# Patient Record
Sex: Male | Born: 1960 | Race: Black or African American | Hispanic: No | Marital: Married | Smoking: Former smoker
Health system: Southern US, Community
[De-identification: ages and names within clinical notes are randomized; demographics above are authoritative.]

## PROBLEM LIST (undated history)

## (undated) DIAGNOSIS — E669 Obesity, unspecified: Secondary | ICD-10-CM

## (undated) DIAGNOSIS — N189 Chronic kidney disease, unspecified: Secondary | ICD-10-CM

## (undated) DIAGNOSIS — F329 Major depressive disorder, single episode, unspecified: Secondary | ICD-10-CM

## (undated) DIAGNOSIS — F32A Depression, unspecified: Secondary | ICD-10-CM

## (undated) DIAGNOSIS — M1990 Unspecified osteoarthritis, unspecified site: Secondary | ICD-10-CM

## (undated) DIAGNOSIS — E109 Type 1 diabetes mellitus without complications: Secondary | ICD-10-CM

## (undated) DIAGNOSIS — Z720 Tobacco use: Secondary | ICD-10-CM

## (undated) DIAGNOSIS — E785 Hyperlipidemia, unspecified: Secondary | ICD-10-CM

## (undated) DIAGNOSIS — M549 Dorsalgia, unspecified: Secondary | ICD-10-CM

## (undated) DIAGNOSIS — I10 Essential (primary) hypertension: Secondary | ICD-10-CM

## (undated) HISTORY — DX: Dorsalgia, unspecified: M54.9

## (undated) HISTORY — DX: Type 1 diabetes mellitus without complications: E10.9

## (undated) HISTORY — DX: Obesity, unspecified: E66.9

## (undated) HISTORY — DX: Essential (primary) hypertension: I10

## (undated) HISTORY — DX: Tobacco use: Z72.0

## (undated) HISTORY — DX: Major depressive disorder, single episode, unspecified: F32.9

## (undated) HISTORY — DX: Hyperlipidemia, unspecified: E78.5

## (undated) HISTORY — DX: Depression, unspecified: F32.A

---
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
## 1993-11-03 HISTORY — PX: OTHER SURGICAL HISTORY: SHX169

## 2001-03-04 ENCOUNTER — Encounter: Payer: Self-pay | Admitting: Family Medicine

## 2001-03-04 ENCOUNTER — Ambulatory Visit (HOSPITAL_COMMUNITY): Admission: RE | Admit: 2001-03-04 | Discharge: 2001-03-04 | Payer: Self-pay | Admitting: Family Medicine

## 2002-10-14 ENCOUNTER — Ambulatory Visit (HOSPITAL_COMMUNITY): Admission: RE | Admit: 2002-10-14 | Discharge: 2002-10-14 | Payer: Self-pay | Admitting: Pulmonary Disease

## 2003-10-09 ENCOUNTER — Ambulatory Visit (HOSPITAL_COMMUNITY): Admission: RE | Admit: 2003-10-09 | Discharge: 2003-10-09 | Payer: Self-pay | Admitting: Family Medicine

## 2003-10-31 ENCOUNTER — Ambulatory Visit (HOSPITAL_COMMUNITY): Admission: RE | Admit: 2003-10-31 | Discharge: 2003-10-31 | Payer: Self-pay | Admitting: Family Medicine

## 2003-11-29 ENCOUNTER — Ambulatory Visit (HOSPITAL_COMMUNITY): Admission: RE | Admit: 2003-11-29 | Discharge: 2003-11-29 | Payer: Self-pay | Admitting: Internal Medicine

## 2003-12-29 ENCOUNTER — Ambulatory Visit (HOSPITAL_COMMUNITY): Admission: RE | Admit: 2003-12-29 | Discharge: 2003-12-29 | Payer: Self-pay | Admitting: Family Medicine

## 2003-12-29 ENCOUNTER — Encounter: Payer: Self-pay | Admitting: Family Medicine

## 2004-01-04 ENCOUNTER — Encounter (HOSPITAL_COMMUNITY): Admission: RE | Admit: 2004-01-04 | Discharge: 2004-02-03 | Payer: Self-pay | Admitting: Family Medicine

## 2004-11-28 ENCOUNTER — Ambulatory Visit: Payer: Self-pay | Admitting: Family Medicine

## 2004-12-30 ENCOUNTER — Ambulatory Visit: Payer: Self-pay | Admitting: Family Medicine

## 2005-02-27 ENCOUNTER — Ambulatory Visit: Payer: Self-pay | Admitting: Family Medicine

## 2005-05-28 ENCOUNTER — Ambulatory Visit: Payer: Self-pay | Admitting: Family Medicine

## 2005-08-27 ENCOUNTER — Ambulatory Visit: Payer: Self-pay | Admitting: Orthopedic Surgery

## 2005-09-09 ENCOUNTER — Ambulatory Visit: Payer: Self-pay | Admitting: Family Medicine

## 2005-09-15 ENCOUNTER — Encounter: Admission: RE | Admit: 2005-09-15 | Discharge: 2005-09-15 | Payer: Self-pay | Admitting: Orthopedic Surgery

## 2005-09-30 ENCOUNTER — Encounter: Admission: RE | Admit: 2005-09-30 | Discharge: 2005-09-30 | Payer: Self-pay | Admitting: Orthopedic Surgery

## 2005-10-23 ENCOUNTER — Ambulatory Visit: Payer: Self-pay | Admitting: Family Medicine

## 2005-11-27 ENCOUNTER — Encounter: Payer: Self-pay | Admitting: Family Medicine

## 2005-11-27 ENCOUNTER — Ambulatory Visit (HOSPITAL_COMMUNITY): Admission: RE | Admit: 2005-11-27 | Discharge: 2005-11-27 | Payer: Self-pay | Admitting: Rheumatology

## 2005-12-01 ENCOUNTER — Ambulatory Visit: Payer: Self-pay | Admitting: Family Medicine

## 2006-02-19 ENCOUNTER — Ambulatory Visit: Payer: Self-pay | Admitting: Family Medicine

## 2006-02-23 ENCOUNTER — Emergency Department: Payer: Self-pay | Admitting: Emergency Medicine

## 2006-02-24 ENCOUNTER — Ambulatory Visit (HOSPITAL_COMMUNITY): Admission: RE | Admit: 2006-02-24 | Discharge: 2006-02-24 | Payer: Self-pay

## 2006-04-27 ENCOUNTER — Ambulatory Visit: Payer: Self-pay | Admitting: Family Medicine

## 2006-06-09 ENCOUNTER — Ambulatory Visit: Payer: Self-pay | Admitting: Family Medicine

## 2006-06-23 ENCOUNTER — Ambulatory Visit: Payer: Self-pay | Admitting: Anesthesiology

## 2006-07-08 ENCOUNTER — Ambulatory Visit: Payer: Self-pay | Admitting: Anesthesiology

## 2006-07-22 ENCOUNTER — Ambulatory Visit: Payer: Self-pay | Admitting: Family Medicine

## 2006-08-04 ENCOUNTER — Ambulatory Visit: Payer: Self-pay | Admitting: Anesthesiology

## 2006-08-25 ENCOUNTER — Ambulatory Visit: Payer: Self-pay | Admitting: Anesthesiology

## 2006-09-23 ENCOUNTER — Ambulatory Visit: Payer: Self-pay | Admitting: Family Medicine

## 2006-09-30 ENCOUNTER — Ambulatory Visit: Payer: Self-pay | Admitting: Anesthesiology

## 2006-10-16 ENCOUNTER — Emergency Department: Payer: Self-pay | Admitting: Emergency Medicine

## 2006-11-03 HISTORY — PX: BACK SURGERY: SHX140

## 2006-11-17 ENCOUNTER — Ambulatory Visit: Payer: Self-pay | Admitting: Anesthesiology

## 2006-11-20 ENCOUNTER — Ambulatory Visit: Payer: Self-pay | Admitting: Family Medicine

## 2006-11-25 ENCOUNTER — Ambulatory Visit: Payer: Self-pay | Admitting: Anesthesiology

## 2006-12-04 ENCOUNTER — Encounter (INDEPENDENT_AMBULATORY_CARE_PROVIDER_SITE_OTHER): Payer: Self-pay | Admitting: *Deleted

## 2006-12-17 ENCOUNTER — Ambulatory Visit: Payer: Self-pay | Admitting: Anesthesiology

## 2006-12-22 ENCOUNTER — Encounter: Payer: Self-pay | Admitting: Family Medicine

## 2006-12-22 LAB — CONVERTED CEMR LAB
Hgb A1c MFr Bld: 6.5 % — ABNORMAL HIGH (ref 4.6–6.1)
PSA: 0.26 ng/mL (ref 0.10–4.00)

## 2007-01-01 ENCOUNTER — Ambulatory Visit: Payer: Self-pay | Admitting: Family Medicine

## 2007-01-01 ENCOUNTER — Ambulatory Visit (HOSPITAL_COMMUNITY): Admission: RE | Admit: 2007-01-01 | Discharge: 2007-01-01 | Payer: Self-pay | Admitting: Neurosurgery

## 2007-01-14 ENCOUNTER — Inpatient Hospital Stay (HOSPITAL_COMMUNITY): Admission: RE | Admit: 2007-01-14 | Discharge: 2007-01-18 | Payer: Self-pay | Admitting: Neurosurgery

## 2007-01-18 ENCOUNTER — Encounter: Payer: Self-pay | Admitting: Family Medicine

## 2007-03-04 ENCOUNTER — Ambulatory Visit: Payer: Self-pay | Admitting: Family Medicine

## 2007-04-22 ENCOUNTER — Encounter: Payer: Self-pay | Admitting: Family Medicine

## 2007-04-22 LAB — CONVERTED CEMR LAB
ALT: 22 units/L (ref 0–53)
Albumin: 4.4 g/dL (ref 3.5–5.2)
Alkaline Phosphatase: 79 units/L (ref 39–117)
Chloride: 104 meq/L (ref 96–112)
HDL: 39 mg/dL — ABNORMAL LOW (ref >39)
LDL Cholesterol: 96 mg/dL (ref 0–99)
Potassium: 4.2 meq/L (ref 3.5–5.3)
Total Protein: 6.9 g/dL (ref 6.0–8.3)
Triglycerides: 114 mg/dL (ref <150)
VLDL: 23 mg/dL (ref 0–40)

## 2007-05-03 ENCOUNTER — Ambulatory Visit: Payer: Self-pay | Admitting: Family Medicine

## 2007-05-05 ENCOUNTER — Ambulatory Visit: Payer: Self-pay | Admitting: Family Medicine

## 2007-06-08 ENCOUNTER — Ambulatory Visit: Payer: Self-pay | Admitting: Family Medicine

## 2007-06-08 ENCOUNTER — Ambulatory Visit (HOSPITAL_COMMUNITY): Admission: RE | Admit: 2007-06-08 | Discharge: 2007-06-08 | Payer: Self-pay | Admitting: Family Medicine

## 2007-06-22 ENCOUNTER — Ambulatory Visit: Payer: Self-pay | Admitting: Cardiovascular Disease

## 2007-07-02 ENCOUNTER — Ambulatory Visit (HOSPITAL_COMMUNITY): Admission: RE | Admit: 2007-07-02 | Discharge: 2007-07-02 | Payer: Self-pay | Admitting: Family Medicine

## 2007-07-02 ENCOUNTER — Ambulatory Visit: Payer: Self-pay | Admitting: Family Medicine

## 2007-07-07 ENCOUNTER — Encounter (HOSPITAL_COMMUNITY): Admission: RE | Admit: 2007-07-07 | Discharge: 2007-08-03 | Payer: Self-pay | Admitting: Cardiovascular Disease

## 2007-07-07 ENCOUNTER — Ambulatory Visit: Payer: Self-pay | Admitting: Cardiology

## 2007-07-07 ENCOUNTER — Ambulatory Visit: Payer: Self-pay | Admitting: Family Medicine

## 2007-07-21 ENCOUNTER — Ambulatory Visit (HOSPITAL_COMMUNITY): Admission: RE | Admit: 2007-07-21 | Discharge: 2007-07-21 | Payer: Self-pay | Admitting: Rheumatology

## 2007-07-27 ENCOUNTER — Ambulatory Visit: Payer: Self-pay | Admitting: Family Medicine

## 2007-08-02 ENCOUNTER — Ambulatory Visit: Payer: Self-pay | Admitting: Family Medicine

## 2007-08-02 LAB — CONVERTED CEMR LAB: Microalb, Ur: 3.7 mg/dL — ABNORMAL HIGH (ref 0.00–1.89)

## 2007-11-04 ENCOUNTER — Encounter: Payer: Self-pay | Admitting: Family Medicine

## 2007-12-23 ENCOUNTER — Encounter: Payer: Self-pay | Admitting: Family Medicine

## 2007-12-23 LAB — CONVERTED CEMR LAB
ALT: 50 units/L (ref 0–53)
AST: 28 units/L (ref 0–37)
Albumin: 4.4 g/dL (ref 3.5–5.2)
Basophils Absolute: 0.1 10*3/uL (ref 0.0–0.1)
Cholesterol: 93 mg/dL (ref 0–200)
Eosinophils Absolute: 0.1 10*3/uL (ref 0.0–0.7)
Glucose, Bld: 152 mg/dL — ABNORMAL HIGH (ref 70–99)
HDL: 36 mg/dL — ABNORMAL LOW (ref >39)
Lymphs Abs: 2.5 10*3/uL (ref 0.7–4.0)
MCV: 89.9 fL (ref 78.0–100.0)
Neutrophils Relative %: 73 % (ref 43–77)
PSA: 0.21 ng/mL (ref 0.10–4.00)
Platelets: 211 10*3/uL (ref 150–400)
Potassium: 4.6 meq/L (ref 3.5–5.3)
RDW: 14.4 % (ref 11.5–15.5)
Sodium: 140 meq/L (ref 135–145)
Total CHOL/HDL Ratio: 2.6
Total Protein: 7.2 g/dL (ref 6.0–8.3)
Triglycerides: 151 mg/dL — ABNORMAL HIGH (ref <150)
VLDL: 30 mg/dL (ref 0–40)
WBC: 13.1 10*3/uL — ABNORMAL HIGH (ref 4.0–10.5)

## 2007-12-28 ENCOUNTER — Ambulatory Visit: Payer: Self-pay | Admitting: Family Medicine

## 2008-02-03 ENCOUNTER — Emergency Department (HOSPITAL_COMMUNITY): Admission: EM | Admit: 2008-02-03 | Discharge: 2008-02-03 | Payer: Self-pay | Admitting: Emergency Medicine

## 2008-02-07 ENCOUNTER — Ambulatory Visit: Payer: Self-pay | Admitting: Family Medicine

## 2008-02-09 ENCOUNTER — Encounter: Payer: Self-pay | Admitting: Family Medicine

## 2008-02-09 LAB — CONVERTED CEMR LAB
Hemoglobin, Urine: NEGATIVE
Ketones, ur: NEGATIVE mg/dL
Leukocytes, UA: NEGATIVE
Nitrite: NEGATIVE
Protein, ur: 30 mg/dL — AB

## 2008-02-15 ENCOUNTER — Encounter (INDEPENDENT_AMBULATORY_CARE_PROVIDER_SITE_OTHER): Payer: Self-pay | Admitting: *Deleted

## 2008-02-15 DIAGNOSIS — E669 Obesity, unspecified: Secondary | ICD-10-CM | POA: Insufficient documentation

## 2008-02-15 DIAGNOSIS — M47816 Spondylosis without myelopathy or radiculopathy, lumbar region: Secondary | ICD-10-CM | POA: Insufficient documentation

## 2008-02-15 DIAGNOSIS — I10 Essential (primary) hypertension: Secondary | ICD-10-CM | POA: Insufficient documentation

## 2008-02-15 DIAGNOSIS — E785 Hyperlipidemia, unspecified: Secondary | ICD-10-CM | POA: Insufficient documentation

## 2008-02-15 DIAGNOSIS — J309 Allergic rhinitis, unspecified: Secondary | ICD-10-CM | POA: Insufficient documentation

## 2008-05-22 ENCOUNTER — Ambulatory Visit: Payer: Self-pay | Admitting: Family Medicine

## 2008-05-22 ENCOUNTER — Ambulatory Visit (HOSPITAL_COMMUNITY): Admission: RE | Admit: 2008-05-22 | Discharge: 2008-05-22 | Payer: Self-pay | Admitting: Family Medicine

## 2008-06-11 ENCOUNTER — Emergency Department (HOSPITAL_COMMUNITY): Admission: EM | Admit: 2008-06-11 | Discharge: 2008-06-11 | Payer: Self-pay | Admitting: Emergency Medicine

## 2008-06-14 ENCOUNTER — Encounter: Payer: Self-pay | Admitting: Family Medicine

## 2008-06-15 ENCOUNTER — Encounter: Payer: Self-pay | Admitting: Family Medicine

## 2008-06-22 ENCOUNTER — Encounter: Payer: Self-pay | Admitting: Family Medicine

## 2008-06-27 ENCOUNTER — Encounter: Payer: Self-pay | Admitting: Family Medicine

## 2008-06-28 ENCOUNTER — Ambulatory Visit: Payer: Self-pay | Admitting: Family Medicine

## 2008-06-28 LAB — CONVERTED CEMR LAB: Hgb A1c MFr Bld: 9.1 %

## 2008-07-20 ENCOUNTER — Emergency Department (HOSPITAL_COMMUNITY): Admission: EM | Admit: 2008-07-20 | Discharge: 2008-07-20 | Payer: Self-pay | Admitting: Emergency Medicine

## 2008-07-27 ENCOUNTER — Telehealth: Payer: Self-pay | Admitting: Family Medicine

## 2008-08-14 ENCOUNTER — Encounter: Payer: Self-pay | Admitting: Family Medicine

## 2008-08-17 ENCOUNTER — Encounter: Payer: Self-pay | Admitting: Family Medicine

## 2008-08-29 ENCOUNTER — Ambulatory Visit: Payer: Self-pay | Admitting: Family Medicine

## 2008-08-29 DIAGNOSIS — Z794 Long term (current) use of insulin: Secondary | ICD-10-CM

## 2008-08-29 DIAGNOSIS — E1165 Type 2 diabetes mellitus with hyperglycemia: Secondary | ICD-10-CM

## 2008-08-29 DIAGNOSIS — IMO0001 Reserved for inherently not codable concepts without codable children: Secondary | ICD-10-CM | POA: Insufficient documentation

## 2008-08-29 LAB — CONVERTED CEMR LAB: Glucose, Bld: 293 mg/dL

## 2008-09-06 ENCOUNTER — Encounter: Payer: Self-pay | Admitting: Family Medicine

## 2008-09-19 ENCOUNTER — Encounter: Payer: Self-pay | Admitting: Family Medicine

## 2008-09-22 ENCOUNTER — Encounter: Payer: Self-pay | Admitting: Family Medicine

## 2008-09-22 LAB — CONVERTED CEMR LAB
AST: 14 units/L (ref 0–37)
Albumin: 4.4 g/dL (ref 3.5–5.2)
Alkaline Phosphatase: 90 units/L (ref 39–117)
CO2: 28 meq/L (ref 19–32)
Calcium: 9.1 mg/dL (ref 8.4–10.5)
Chloride: 100 meq/L (ref 96–112)
Creatinine, Ser: 0.78 mg/dL (ref 0.40–1.50)
HDL: 37 mg/dL — ABNORMAL LOW (ref >39)
LDL Cholesterol: 43 mg/dL (ref 0–99)
Sodium: 140 meq/L (ref 135–145)
Total Bilirubin: 0.4 mg/dL (ref 0.3–1.2)
Total CHOL/HDL Ratio: 2.8
Total Protein: 7.2 g/dL (ref 6.0–8.3)
Triglycerides: 109 mg/dL (ref <150)

## 2008-10-02 ENCOUNTER — Ambulatory Visit: Payer: Self-pay | Admitting: Family Medicine

## 2008-10-02 LAB — CONVERTED CEMR LAB
Creatinine, Urine: 130.2 mg/dL
Hgb A1c MFr Bld: 10.2 %
Microalb Creat Ratio: 300.3 mg/g — ABNORMAL HIGH (ref 0.0–30.0)
Microalb, Ur: 39.1 mg/dL — ABNORMAL HIGH (ref 0.00–1.89)

## 2008-10-05 ENCOUNTER — Encounter: Payer: Self-pay | Admitting: Family Medicine

## 2008-10-16 ENCOUNTER — Encounter: Payer: Self-pay | Admitting: Family Medicine

## 2008-11-06 ENCOUNTER — Ambulatory Visit: Payer: Self-pay | Admitting: Family Medicine

## 2008-12-06 ENCOUNTER — Ambulatory Visit: Payer: Self-pay | Admitting: Family Medicine

## 2008-12-06 LAB — CONVERTED CEMR LAB: Glucose, Bld: 208 mg/dL

## 2008-12-10 ENCOUNTER — Encounter: Payer: Self-pay | Admitting: Family Medicine

## 2008-12-10 ENCOUNTER — Emergency Department (HOSPITAL_COMMUNITY): Admission: EM | Admit: 2008-12-10 | Discharge: 2008-12-10 | Payer: Self-pay | Admitting: Emergency Medicine

## 2008-12-13 ENCOUNTER — Ambulatory Visit: Payer: Self-pay | Admitting: Family Medicine

## 2008-12-13 LAB — CONVERTED CEMR LAB: Glucose, Bld: 171 mg/dL

## 2008-12-14 ENCOUNTER — Telehealth: Payer: Self-pay | Admitting: Family Medicine

## 2008-12-14 LAB — CONVERTED CEMR LAB
BUN: 13 mg/dL (ref 6–23)
Basophils Absolute: 0.1 10*3/uL (ref 0.0–0.1)
CO2: 26 meq/L (ref 19–32)
Calcium: 9.6 mg/dL (ref 8.4–10.5)
Chloride: 100 meq/L (ref 96–112)
Creatinine, Ser: 0.89 mg/dL (ref 0.40–1.50)
Eosinophils Relative: 1 % (ref 0–5)
Glucose, Bld: 162 mg/dL — ABNORMAL HIGH (ref 70–99)
HCT: 52 % (ref 39.0–52.0)
Hemoglobin: 17.5 g/dL — ABNORMAL HIGH (ref 13.0–17.0)
Lymphocytes Relative: 18 % (ref 12–46)
Lymphs Abs: 2.6 10*3/uL (ref 0.7–4.0)
Monocytes Absolute: 1.2 10*3/uL — ABNORMAL HIGH (ref 0.1–1.0)
Monocytes Relative: 8 % (ref 3–12)
Neutro Abs: 10.7 10*3/uL — ABNORMAL HIGH (ref 1.7–7.7)
RBC: 5.9 M/uL — ABNORMAL HIGH (ref 4.22–5.81)

## 2008-12-27 ENCOUNTER — Encounter: Payer: Self-pay | Admitting: Family Medicine

## 2008-12-28 ENCOUNTER — Ambulatory Visit: Payer: Self-pay | Admitting: Family Medicine

## 2008-12-28 LAB — CONVERTED CEMR LAB: Hgb A1c MFr Bld: 8 %

## 2009-01-02 ENCOUNTER — Telehealth: Payer: Self-pay | Admitting: Family Medicine

## 2009-01-03 ENCOUNTER — Encounter: Payer: Self-pay | Admitting: Family Medicine

## 2009-01-05 ENCOUNTER — Encounter: Payer: Self-pay | Admitting: Family Medicine

## 2009-01-17 ENCOUNTER — Ambulatory Visit: Payer: Self-pay | Admitting: Family Medicine

## 2009-01-18 ENCOUNTER — Encounter: Payer: Self-pay | Admitting: Family Medicine

## 2009-02-23 ENCOUNTER — Encounter: Payer: Self-pay | Admitting: Family Medicine

## 2009-03-27 ENCOUNTER — Encounter: Payer: Self-pay | Admitting: Family Medicine

## 2009-04-03 ENCOUNTER — Ambulatory Visit: Payer: Self-pay | Admitting: Family Medicine

## 2009-04-03 LAB — CONVERTED CEMR LAB
Glucose, Bld: 190 mg/dL
Hgb A1c MFr Bld: 7 %
Microalb Creat Ratio: 620.2 mg/g — ABNORMAL HIGH (ref 0.0–30.0)
Microalb, Ur: 54.58 mg/dL — ABNORMAL HIGH (ref 0.00–1.89)

## 2009-04-06 ENCOUNTER — Telehealth: Payer: Self-pay | Admitting: Family Medicine

## 2009-04-06 ENCOUNTER — Encounter: Payer: Self-pay | Admitting: Family Medicine

## 2009-04-09 LAB — CONVERTED CEMR LAB
AST: 15 units/L (ref 0–37)
Bilirubin, Direct: 0.1 mg/dL (ref 0.0–0.3)
Indirect Bilirubin: 0.3 mg/dL (ref 0.0–0.9)
PSA: 0.23 ng/mL (ref 0.10–4.00)
TSH: 1.229 microintl units/mL (ref 0.350–4.500)
Total Bilirubin: 0.4 mg/dL (ref 0.3–1.2)
Total CHOL/HDL Ratio: 3.1

## 2009-04-10 ENCOUNTER — Encounter: Payer: Self-pay | Admitting: Family Medicine

## 2009-04-24 ENCOUNTER — Encounter: Payer: Self-pay | Admitting: Family Medicine

## 2009-05-17 ENCOUNTER — Encounter: Payer: Self-pay | Admitting: Family Medicine

## 2009-06-05 ENCOUNTER — Telehealth: Payer: Self-pay | Admitting: Family Medicine

## 2009-06-05 ENCOUNTER — Encounter: Payer: Self-pay | Admitting: Family Medicine

## 2009-06-06 ENCOUNTER — Encounter: Payer: Self-pay | Admitting: Family Medicine

## 2009-06-06 LAB — HM DIABETES EYE EXAM

## 2009-06-12 ENCOUNTER — Encounter: Payer: Self-pay | Admitting: Family Medicine

## 2009-06-25 ENCOUNTER — Encounter: Payer: Self-pay | Admitting: Family Medicine

## 2009-06-26 ENCOUNTER — Encounter: Payer: Self-pay | Admitting: Family Medicine

## 2009-07-03 ENCOUNTER — Telehealth: Payer: Self-pay | Admitting: Family Medicine

## 2009-07-10 ENCOUNTER — Encounter: Payer: Self-pay | Admitting: Family Medicine

## 2009-07-11 ENCOUNTER — Ambulatory Visit: Payer: Self-pay | Admitting: Family Medicine

## 2009-07-12 ENCOUNTER — Encounter: Payer: Self-pay | Admitting: Family Medicine

## 2009-07-16 ENCOUNTER — Encounter: Payer: Self-pay | Admitting: Family Medicine

## 2009-07-17 LAB — CONVERTED CEMR LAB: Hgb A1c MFr Bld: 7.7 % — ABNORMAL HIGH (ref 4.6–6.1)

## 2009-07-19 ENCOUNTER — Encounter: Payer: Self-pay | Admitting: Gastroenterology

## 2009-07-19 ENCOUNTER — Encounter: Payer: Self-pay | Admitting: Family Medicine

## 2009-08-20 ENCOUNTER — Other Ambulatory Visit: Payer: Self-pay | Admitting: Gastroenterology

## 2009-08-20 ENCOUNTER — Telehealth: Payer: Self-pay | Admitting: Family Medicine

## 2009-08-23 ENCOUNTER — Ambulatory Visit (HOSPITAL_COMMUNITY): Admission: RE | Admit: 2009-08-23 | Discharge: 2009-08-23 | Payer: Self-pay | Admitting: Gastroenterology

## 2009-08-23 ENCOUNTER — Encounter: Payer: Self-pay | Admitting: Gastroenterology

## 2009-08-23 ENCOUNTER — Ambulatory Visit: Payer: Self-pay | Admitting: Gastroenterology

## 2009-08-27 ENCOUNTER — Encounter (INDEPENDENT_AMBULATORY_CARE_PROVIDER_SITE_OTHER): Payer: Self-pay

## 2009-08-31 ENCOUNTER — Telehealth: Payer: Self-pay | Admitting: Family Medicine

## 2009-09-04 ENCOUNTER — Telehealth: Payer: Self-pay | Admitting: Family Medicine

## 2009-09-10 ENCOUNTER — Ambulatory Visit: Payer: Self-pay | Admitting: Family Medicine

## 2009-09-10 LAB — CONVERTED CEMR LAB: Glucose, Bld: 188 mg/dL

## 2009-09-18 ENCOUNTER — Telehealth: Payer: Self-pay | Admitting: Family Medicine

## 2009-10-01 ENCOUNTER — Encounter: Payer: Self-pay | Admitting: Family Medicine

## 2009-10-02 ENCOUNTER — Ambulatory Visit: Payer: Self-pay | Admitting: Family Medicine

## 2009-10-02 ENCOUNTER — Encounter: Payer: Self-pay | Admitting: Family Medicine

## 2009-10-02 ENCOUNTER — Ambulatory Visit (HOSPITAL_COMMUNITY): Admission: RE | Admit: 2009-10-02 | Discharge: 2009-10-02 | Payer: Self-pay | Admitting: Family Medicine

## 2009-10-02 LAB — CONVERTED CEMR LAB
Albumin: 4.4 g/dL (ref 3.5–5.2)
Alkaline Phosphatase: 94 units/L (ref 39–117)
BUN: 16 mg/dL (ref 6–23)
Creatinine, Ser: 0.8 mg/dL (ref 0.40–1.50)
Glucose, Bld: 75 mg/dL (ref 70–99)
HDL: 42 mg/dL (ref >39)
Hgb A1c MFr Bld: 6.8 %
LDL Cholesterol: 99 mg/dL (ref 0–99)
Sodium: 141 meq/L (ref 135–145)
Total Bilirubin: 0.4 mg/dL (ref 0.3–1.2)
Total CHOL/HDL Ratio: 4
Total Protein: 7 g/dL (ref 6.0–8.3)
Triglycerides: 144 mg/dL (ref <150)
VLDL: 29 mg/dL (ref 0–40)

## 2009-10-04 ENCOUNTER — Encounter: Payer: Self-pay | Admitting: Family Medicine

## 2009-10-30 ENCOUNTER — Ambulatory Visit: Payer: Self-pay | Admitting: Family Medicine

## 2009-10-30 LAB — CONVERTED CEMR LAB: Blood Glucose, Fasting: 98 mg/dL

## 2009-10-31 ENCOUNTER — Encounter: Payer: Self-pay | Admitting: Family Medicine

## 2009-11-07 ENCOUNTER — Encounter: Payer: Self-pay | Admitting: Family Medicine

## 2009-11-20 ENCOUNTER — Telehealth: Payer: Self-pay | Admitting: Family Medicine

## 2009-11-26 ENCOUNTER — Telehealth: Payer: Self-pay | Admitting: Family Medicine

## 2009-11-27 LAB — CONVERTED CEMR LAB
ALT: 17 units/L (ref 0–53)
Albumin: 4.5 g/dL (ref 3.5–5.2)
Chloride: 102 meq/L (ref 96–112)
Cholesterol: 92 mg/dL (ref 0–200)
HDL: 37 mg/dL — ABNORMAL LOW (ref >39)
Indirect Bilirubin: 0.3 mg/dL (ref 0.0–0.9)
LDL Cholesterol: 37 mg/dL (ref 0–99)
Potassium: 4.7 meq/L (ref 3.5–5.3)
Sodium: 140 meq/L (ref 135–145)
Total CHOL/HDL Ratio: 2.5
Total Protein: 7.3 g/dL (ref 6.0–8.3)
Triglycerides: 89 mg/dL (ref <150)
VLDL: 18 mg/dL (ref 0–40)

## 2009-11-30 ENCOUNTER — Encounter: Payer: Self-pay | Admitting: Family Medicine

## 2009-12-05 ENCOUNTER — Telehealth: Payer: Self-pay | Admitting: Family Medicine

## 2009-12-26 ENCOUNTER — Ambulatory Visit: Payer: Self-pay | Admitting: Family Medicine

## 2009-12-26 DIAGNOSIS — R5383 Other fatigue: Secondary | ICD-10-CM

## 2009-12-26 DIAGNOSIS — N529 Male erectile dysfunction, unspecified: Secondary | ICD-10-CM | POA: Insufficient documentation

## 2009-12-26 DIAGNOSIS — R5381 Other malaise: Secondary | ICD-10-CM | POA: Insufficient documentation

## 2009-12-28 LAB — CONVERTED CEMR LAB
Basophils Absolute: 0.1 10*3/uL (ref 0.0–0.1)
Basophils Relative: 1 % (ref 0–1)
Hemoglobin: 16 g/dL (ref 13.0–17.0)
Hgb A1c MFr Bld: 7.7 % — ABNORMAL HIGH (ref 4.6–6.1)
Lymphocytes Relative: 21 % (ref 12–46)
MCHC: 31.9 g/dL (ref 30.0–36.0)
Monocytes Absolute: 0.9 10*3/uL (ref 0.1–1.0)
Monocytes Relative: 9 % (ref 3–12)
Neutro Abs: 7.2 10*3/uL (ref 1.7–7.7)
Neutrophils Relative %: 68 % (ref 43–77)
RBC: 5.62 M/uL (ref 4.22–5.81)
RDW: 14.6 % (ref 11.5–15.5)

## 2010-01-17 ENCOUNTER — Encounter: Payer: Self-pay | Admitting: Family Medicine

## 2010-02-07 ENCOUNTER — Telehealth: Payer: Self-pay | Admitting: Family Medicine

## 2010-02-08 ENCOUNTER — Encounter: Payer: Self-pay | Admitting: Family Medicine

## 2010-02-21 ENCOUNTER — Ambulatory Visit: Payer: Self-pay | Admitting: Family Medicine

## 2010-02-21 LAB — CONVERTED CEMR LAB: Blood Glucose, Fasting: 132 mg/dL

## 2010-03-03 IMAGING — CR DG ABDOMEN ACUTE W/ 1V CHEST
3 series · 3 of 3 positions shown · non-contrast
Comparison: Chest x-ray of 06/11/2008

CLINICAL DATA: Upper mid abdominal pain radiating to the left

ACUTE ABDOMEN SERIES (ABDOMEN 2 VIEW & CHEST 1 VIEW)

[view not recorded (1 of 3)]
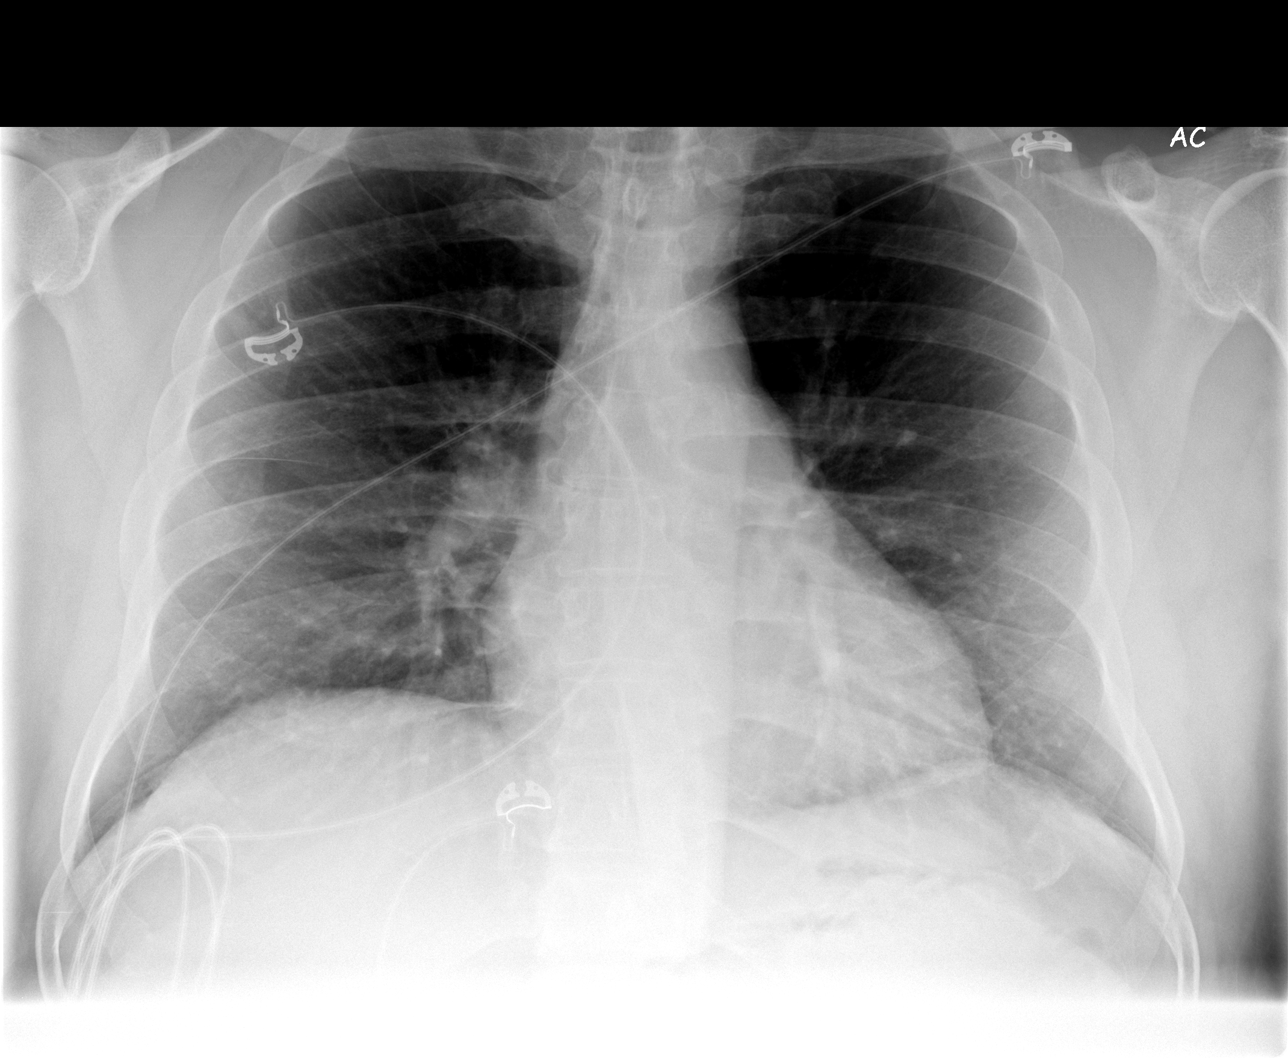

[view not recorded (2 of 3)]
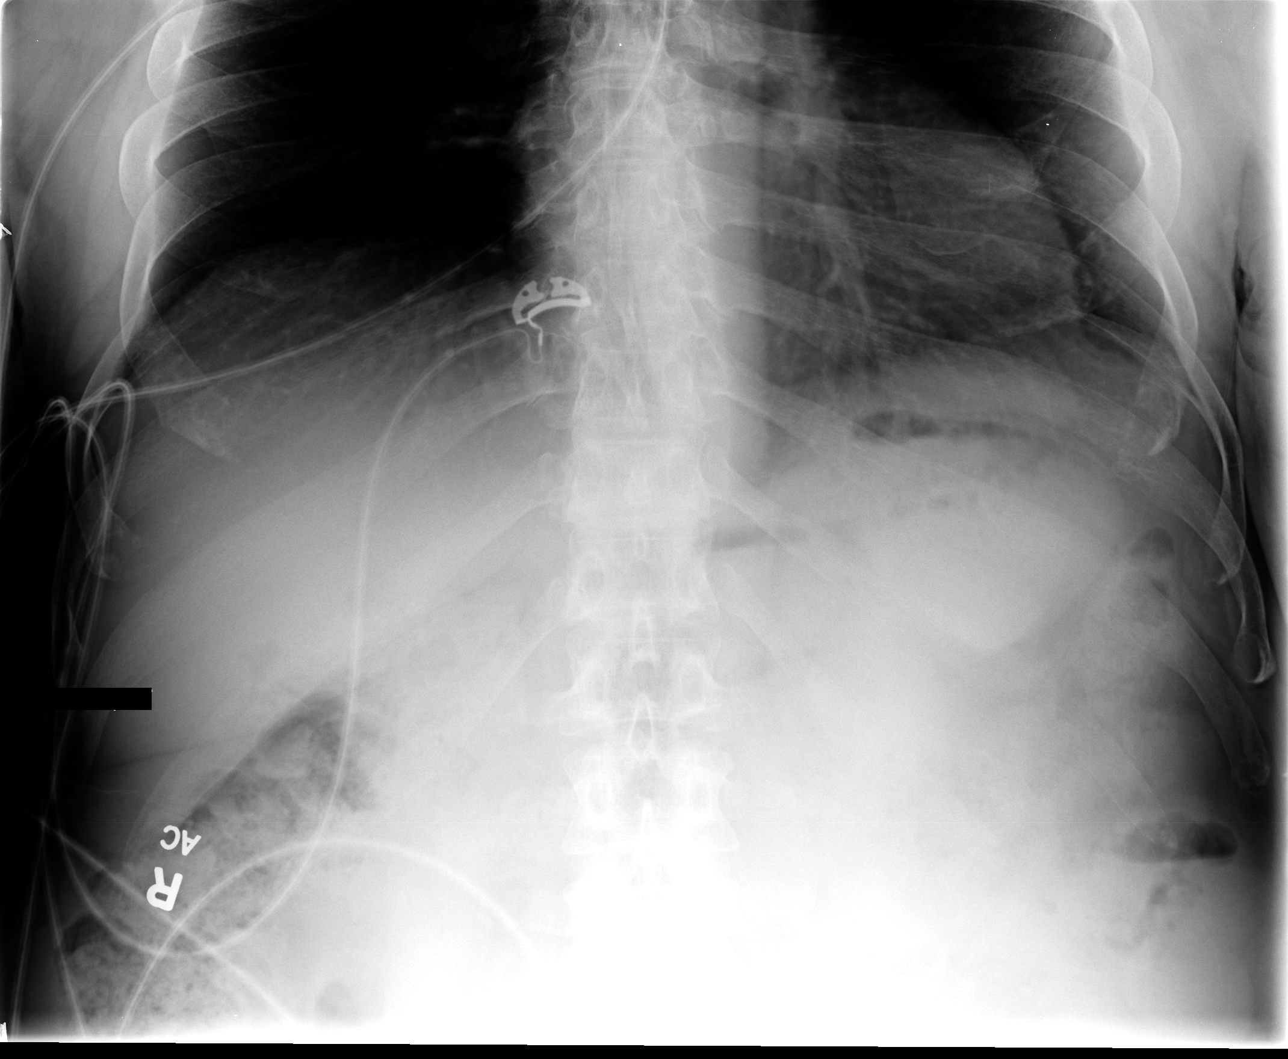

[view not recorded (3 of 3)]
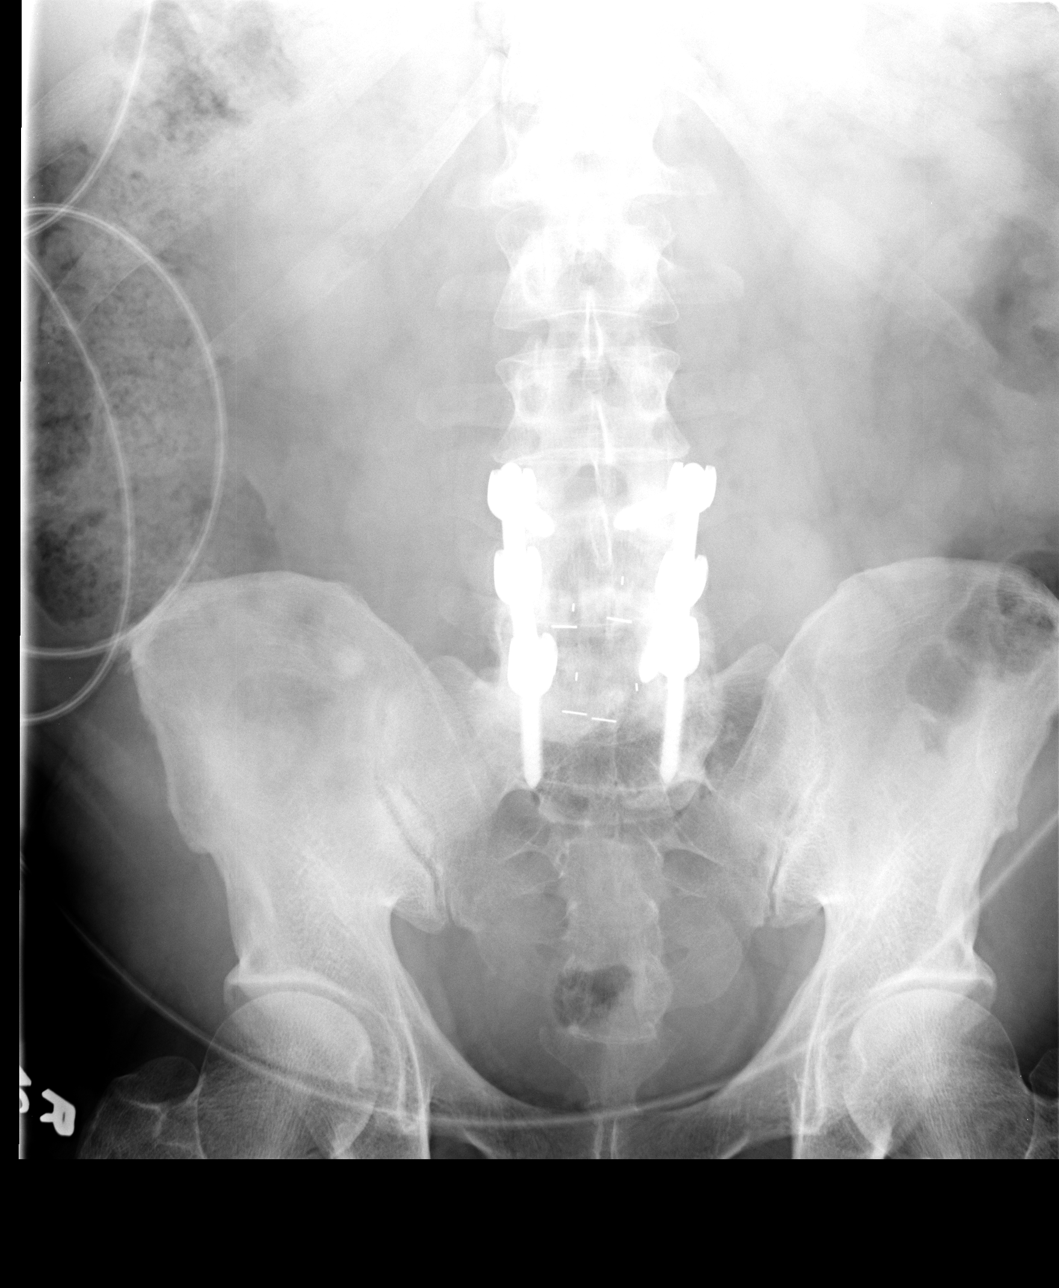

[3 of 3 positions shown; findings below may reference images not displayed]

FINDINGS: The lungs are clear.  Heart is within normal limits in
size.  No bony abnormality is seen.

Supine and erect views of the abdomen show a moderate amount of
feces in the right colon.  No bowel obstruction is seen and no free
air is noted.  Posterior fusion of L4, L5 and S1 is noted.
IMPRESSION: 1.  No active lung disease.
2.  No obstruction or free air.

## 2010-04-18 ENCOUNTER — Ambulatory Visit: Payer: Self-pay | Admitting: Family Medicine

## 2010-04-18 DIAGNOSIS — M25529 Pain in unspecified elbow: Secondary | ICD-10-CM | POA: Insufficient documentation

## 2010-04-19 ENCOUNTER — Encounter: Payer: Self-pay | Admitting: Family Medicine

## 2010-04-21 ENCOUNTER — Encounter: Payer: Self-pay | Admitting: Family Medicine

## 2010-04-21 LAB — CONVERTED CEMR LAB
Amphetamine Screen, Ur: NEGATIVE
Barbiturate Quant, Ur: NEGATIVE
Cocaine Metabolites: NEGATIVE
Methadone: NEGATIVE
Opiate Screen, Urine: NEGATIVE

## 2010-04-23 LAB — CONVERTED CEMR LAB: PSA: 0.22 ng/mL (ref 0.10–4.00)

## 2010-04-25 ENCOUNTER — Encounter: Payer: Self-pay | Admitting: Family Medicine

## 2010-05-02 ENCOUNTER — Encounter: Payer: Self-pay | Admitting: Family Medicine

## 2010-05-03 HISTORY — PX: OTHER SURGICAL HISTORY: SHX169

## 2010-05-15 ENCOUNTER — Encounter: Payer: Self-pay | Admitting: Family Medicine

## 2010-05-29 ENCOUNTER — Ambulatory Visit: Payer: Self-pay | Admitting: Unknown Physician Specialty

## 2010-05-30 LAB — PATHOLOGY REPORT

## 2010-06-04 ENCOUNTER — Ambulatory Visit: Payer: Self-pay | Admitting: Family Medicine

## 2010-06-05 ENCOUNTER — Encounter: Payer: Self-pay | Admitting: Family Medicine

## 2010-07-04 ENCOUNTER — Telehealth: Payer: Self-pay | Admitting: Family Medicine

## 2010-07-09 ENCOUNTER — Encounter: Payer: Self-pay | Admitting: Family Medicine

## 2010-07-12 ENCOUNTER — Encounter: Payer: Self-pay | Admitting: Family Medicine

## 2010-07-15 ENCOUNTER — Telehealth: Payer: Self-pay | Admitting: Family Medicine

## 2010-07-19 LAB — CONVERTED CEMR LAB
ALT: 38 units/L (ref 0–53)
BUN: 12 mg/dL (ref 6–23)
Bilirubin, Direct: 0.1 mg/dL (ref 0.0–0.3)
CO2: 27 meq/L (ref 19–32)
Chloride: 104 meq/L (ref 96–112)
Glucose, Bld: 108 mg/dL — ABNORMAL HIGH (ref 70–99)
Hgb A1c MFr Bld: 7.5 % — ABNORMAL HIGH (ref <5.7)
Indirect Bilirubin: 0.3 mg/dL (ref 0.0–0.9)
LDL Cholesterol: 36 mg/dL (ref 0–99)
Potassium: 4.5 meq/L (ref 3.5–5.3)
Sodium: 141 meq/L (ref 135–145)
Total Bilirubin: 0.4 mg/dL (ref 0.3–1.2)
Total CHOL/HDL Ratio: 2.5
VLDL: 12 mg/dL (ref 0–40)
Vit D, 25-Hydroxy: 29 ng/mL — ABNORMAL LOW (ref 30–89)

## 2010-07-22 ENCOUNTER — Telehealth: Payer: Self-pay | Admitting: Family Medicine

## 2010-07-23 ENCOUNTER — Encounter: Payer: Self-pay | Admitting: Family Medicine

## 2010-07-25 ENCOUNTER — Ambulatory Visit: Payer: Self-pay | Admitting: Family Medicine

## 2010-09-05 ENCOUNTER — Ambulatory Visit: Payer: Self-pay | Admitting: Family Medicine

## 2010-09-18 ENCOUNTER — Telehealth: Payer: Self-pay | Admitting: Family Medicine

## 2010-10-02 ENCOUNTER — Telehealth: Payer: Self-pay | Admitting: Family Medicine

## 2010-10-14 ENCOUNTER — Telehealth: Payer: Self-pay | Admitting: Family Medicine

## 2010-10-25 ENCOUNTER — Encounter: Payer: Self-pay | Admitting: Family Medicine

## 2010-11-11 ENCOUNTER — Encounter: Payer: Self-pay | Admitting: Family Medicine

## 2010-11-12 ENCOUNTER — Encounter: Payer: Self-pay | Admitting: Family Medicine

## 2010-11-12 LAB — CONVERTED CEMR LAB
Albumin: 4.5 g/dL (ref 3.5–5.2)
Alkaline Phosphatase: 85 units/L (ref 39–117)
Chloride: 102 meq/L (ref 96–112)
Creatinine, Ser: 0.67 mg/dL (ref 0.40–1.50)
HDL: 35 mg/dL — ABNORMAL LOW (ref >39)
LDL Cholesterol: 46 mg/dL (ref 0–99)
Potassium: 4.3 meq/L (ref 3.5–5.3)
Sodium: 140 meq/L (ref 135–145)
TSH: 1.12 microintl units/mL (ref 0.350–4.500)
Total CHOL/HDL Ratio: 2.9
Total Protein: 7.3 g/dL (ref 6.0–8.3)
Triglycerides: 101 mg/dL (ref <150)

## 2010-11-19 ENCOUNTER — Encounter: Payer: Self-pay | Admitting: Family Medicine

## 2010-11-19 ENCOUNTER — Ambulatory Visit
Admission: RE | Admit: 2010-11-19 | Discharge: 2010-11-19 | Payer: Self-pay | Source: Home / Self Care | Attending: Family Medicine | Admitting: Family Medicine

## 2010-11-23 ENCOUNTER — Encounter: Payer: Self-pay | Admitting: Orthopedic Surgery

## 2010-11-25 ENCOUNTER — Telehealth: Payer: Self-pay | Admitting: Family Medicine

## 2010-12-03 NOTE — Progress Notes (Signed)
Summary: [facility]   Phone Note Call from Patient   Summary of Call: [facility]  is going to fax over all his medicines  and he wants you to send it over and also add flex pen needles. this is where he is going to start getting his rx at Initial call taken by: Lind Guest,  October 02, 2010 2:01 PM  Follow-up for Phone Call        noted Follow-up by: Adella Hare LPN,  October 02, 2010 6:03 PM

## 2010-12-03 NOTE — Assessment & Plan Note (Signed)
Summary: office visit   Vital Signs:  Patient profile:   50 year old male Height:      70 inches Weight:      256.75 pounds BMI:     36.97 O2 Sat:      93 % Pulse rate:   90 / minute Pulse rhythm:   regular Resp:     16 per minute BP sitting:   146 / 100  (left arm)  Vitals Entered By: Everitt Amber LPN (February 21, 2010 8:17 AM)  Nutrition Counseling: Patient's BMI is greater than 25 and therefore counseled on weight management options. CC: Follow up chronic problems   Primary Care Provider:  Syliva Overman MD  CC:  Follow up chronic problems.  History of Present Illness: Reports  that he has not been doing well. Denies recent fever or chills. Denies sinus pressure, nasal congestion , ear pain or sore throat. Denies chest congestion, or cough productive of sputum. Denies chest pain, palpitations, PND, orthopnea or leg swelling. Denies abdominal pain, nausea, vomitting, diarrhea or constipation. Denies change in bowel movements or bloody stool. Denies dysuria , frequency, incontinence or hesitancy. Reports unconrolled back pain with nimbness and tingling in the feet. Denies headaches, vertigo, seizures. Denies depression, anxiety or insomnia. Denies  rash, lesions, or itch.       Current Medications (verified): 1)  Aspirin 81 Mg  Tbec (Aspirin) .... One Tab By Mouth Once Daily 2)  Exforge 10-320 Mg Tabs (Amlodipine Besylate-Valsartan) .... Take 1 Tablet By Mouth Once A Day 3)  Glucotrol 10 Mg Tabs (Glipizide) .... Take One Tab By Mouth Two Times A Day 4)  Crestor 20 Mg Tabs (Rosuvastatin Calcium) .... Take One Tab By Mouth Once Daily 5)  Lyrica 50 Mg Caps (Pregabalin) .... Take 1 Tablet By Mouth Three Times A Day 6)  Singulair 10 Mg Tabs (Montelukast Sodium) .... Take 1 Tablet By Mouth Once A Day 7)  Cialis 20 Mg Tabs (Tadalafil) .... Uad 8)  Flexeril 10 Mg Tabs (Cyclobenzaprine Hcl) .... Take 1 Tablet By Mouth Two Times A Day 9)  Ranitidine Hcl 150 Mg Caps  (Ranitidine Hcl) .... Take 1 Capsule By Mouth Two Times A Day 10)  Prodigy Blood Glucose Test  Strp (Glucose Blood) .... Twice Daily Testing 11)  Freestyle Test  Strp (Glucose Blood) .... Three Times A Day 12)  Benazepril Hcl 40 Mg Tabs (Benazepril Hcl) .... Take 1 Tablet By Mouth Once A Day 13)  Tramadol Hcl 50 Mg Tabs (Tramadol Hcl) .... Take 1 Tablet By Mouth Three Times A Day 14)  Levemir Flexpen 100 Unit/ml Soln (Insulin Detemir) .Marland Kitchen.. 100 Units Daily 15)  Janumet 50-1000 Mg Tabs (Sitagliptin-Metformin Hcl) .... Take 1 Tablet By Mouth Two Times A Day 16)  Clonidine Hcl 0.3 Mg Tabs (Clonidine Hcl) .... One and A Half Tablets At Bedtime 17)  Etodolac 500 Mg Tabs (Etodolac) .... Take 1 Tablet By Mouth Two Times A Day 18)  Pen Needles 5/16" 31g X 8 Mm Misc (Insulin Pen Needle) .... Once Daily Use  Allergies (verified): 1)  ! Codeine  Review of Systems      See HPI Eyes:  Denies discharge, eye pain, and red eye. MS:  Complains of low back pain and mid back pain; uncontrolled low back , pelvis and lower ext pain, stabbing, sticking pains. Endo:  Denies excessive hunger and excessive thirst; tests 3 times daily, fastings 90 to 115, after lunch 210, bedtime 230. Heme:  Denies abnormal bruising and  bleeding. Allergy:  Complains of seasonal allergies.  Physical Exam  General:  Well-developed,obese,in no acute distress; alert,appropriate and cooperative throughout examination HEENT: No facial asymmetry,  EOMI, No sinus tenderness, TM's Clear, oropharynx  pink and moist.   Chest: Clear to auscultation bilaterally.  CVS: S1, S2, No murmurs, No S3.   Abd: Soft, Nontender.  MS: decreased ROM spine,adequate in  hips, shoulders and knees.  Ext: No edema.   CNS: CN 2-12 intact, power tone and sensation normal throughout.   Skin: Intact, no visible lesions or rashes.  Psych: Good eye contact, normal affect.  Memory intact, not anxious or depressed appearing.    Impression &  Recommendations:  Problem # 1:  DIABETES MELLITUS, TYPE II (ICD-250.00) Assessment Comment Only  The following medications were removed from the medication list:    Levemir Flexpen 100 Unit/ml Soln (Insulin detemir) .Marland KitchenMarland KitchenMarland KitchenMarland Kitchen 100 units daily His updated medication list for this problem includes:    Aspirin 81 Mg Tbec (Aspirin) ..... One tab by mouth once daily    Exforge 10-320 Mg Tabs (Amlodipine besylate-valsartan) .Marland Kitchen... Take 1 tablet by mouth once a day    Glucotrol 10 Mg Tabs (Glipizide) .Marland Kitchen... Take one tab by mouth two times a day    Benazepril Hcl 40 Mg Tabs (Benazepril hcl) .Marland Kitchen... Take 1 tablet by mouth once a day    Janumet 50-1000 Mg Tabs (Sitagliptin-metformin hcl) .Marland Kitchen... Take 1 tablet by mouth two times a day    Levemir Flexpen 100 Unit/ml Soln (Insulin detemir) .Marland KitchenMarland KitchenMarland KitchenMarland Kitchen 60 units twice daily  Orders: Glucose, (CBG) (82962) T- Hemoglobin A1C [phone])  Labs Reviewed: Creat: 0.74 (11/26/2009)     Last Eye Exam: diabetic retinopathy (06/06/2009) Reviewed HgBA1c results: 7.7 (12/26/2009)  6.8 (10/02/2009)  Problem # 2:  ERECTILE DYSFUNCTION, ORGANIC (ICD-607.84) Assessment: Unchanged  His updated medication list for this problem includes:    Cialis 20 Mg Tabs (Tadalafil) ..... Uad  Problem # 3:  HYPERLIPIDEMIA (ICD-272.4) Assessment: Comment Only  His updated medication list for this problem includes:    Crestor 20 Mg Tabs (Rosuvastatin calcium) .Marland Kitchen... Take one tab by mouth once daily  Labs Reviewed: SGOT: 14 (11/26/2009)   SGPT: 17 (11/26/2009)   HDL:37 (11/26/2009), 42 (10/02/2009)  LDL:37 (11/26/2009), 99 (10/02/2009)  Chol:92 (11/26/2009), 170 (10/02/2009)  Trig:89 (11/26/2009), 144 (10/02/2009)  Problem # 4:  OBESITY (ICD-278.00) Assessment: Deteriorated  Ht: 70 (02/21/2010)   Wt: 256.75 (02/21/2010)   BMI: 36.97 (02/21/2010)  Problem # 5:  HYPERTENSION (ICD-401.9) Assessment: Unchanged  The following medications were removed from the medication list:     Clonidine Hcl 0.3 Mg Tabs (Clonidine hcl) ..... One and a half tablets at bedtime His updated medication list for this problem includes:    Exforge 10-320 Mg Tabs (Amlodipine besylate-valsartan) .Marland Kitchen... Take 1 tablet by mouth once a day    Benazepril Hcl 40 Mg Tabs (Benazepril hcl) .Marland Kitchen... Take 1 tablet by mouth once a day    Clonidine Hcl 0.3 Mg Tabs (Clonidine hcl) .Marland Kitchen... Take 1 tablet by mouth two times a day  BP today: 146/100 Prior BP: 140/90 (12/26/2009)  Labs Reviewed: K+: 4.7 (11/26/2009) Creat: : 0.74 (11/26/2009)   Chol: 92 (11/26/2009)   HDL: 37 (11/26/2009)   LDL: 37 (11/26/2009)   TG: 89 (11/26/2009)  Problem # 6:  BACK PAIN (ICD-724.5) Assessment: Deteriorated  The following medications were removed from the medication list:    Tramadol Hcl 50 Mg Tabs (Tramadol hcl) .Marland Kitchen... Take 1 tablet by mouth three times a day  His updated medication list for this problem includes:    Aspirin 81 Mg Tbec (Aspirin) ..... One tab by mouth once daily    Flexeril 10 Mg Tabs (Cyclobenzaprine hcl) .Marland Kitchen... Take 1 tablet by mouth two times a day    Etodolac 500 Mg Tabs (Etodolac) .Marland Kitchen... Take 1 tablet by mouth two times a day    Percocet 7.5-500 Mg Tabs (Oxycodone-acetaminophen) .Marland Kitchen... Take 1 tablet by mouth three times a day  Complete Medication List: 1)  Aspirin 81 Mg Tbec (Aspirin) .... One tab by mouth once daily 2)  Exforge 10-320 Mg Tabs (Amlodipine besylate-valsartan) .... Take 1 tablet by mouth once a day 3)  Glucotrol 10 Mg Tabs (Glipizide) .... Take one tab by mouth two times a day 4)  Crestor 20 Mg Tabs (Rosuvastatin calcium) .... Take one tab by mouth once daily 5)  Singulair 10 Mg Tabs (Montelukast sodium) .... Take 1 tablet by mouth once a day 6)  Cialis 20 Mg Tabs (Tadalafil) .... Uad 7)  Flexeril 10 Mg Tabs (Cyclobenzaprine hcl) .... Take 1 tablet by mouth two times a day 8)  Ranitidine Hcl 150 Mg Caps (Ranitidine hcl) .... Take 1 capsule by mouth two times a day 9)  Prodigy Blood Glucose  Test Strp (Glucose blood) .... Twice daily testing 10)  Freestyle Test Strp (Glucose blood) .... Three times a day 11)  Benazepril Hcl 40 Mg Tabs (Benazepril hcl) .... Take 1 tablet by mouth once a day 12)  Janumet 50-1000 Mg Tabs (Sitagliptin-metformin hcl) .... Take 1 tablet by mouth two times a day 13)  Etodolac 500 Mg Tabs (Etodolac) .... Take 1 tablet by mouth two times a day 14)  Pen Needles 5/16" 31g X 8 Mm Misc (Insulin pen needle) .... Once daily use 15)  Clonidine Hcl 0.3 Mg Tabs (Clonidine hcl) .... Take 1 tablet by mouth two times a day 16)  Levemir Flexpen 100 Unit/ml Soln (Insulin detemir) .... 60 units twice daily 17)  Lyrica 75 Mg Caps (Pregabalin) .... Take 1 capsule by mouth three times a day 18)  Percocet 7.5-500 Mg Tabs (Oxycodone-acetaminophen) .... Take 1 tablet by mouth three times a day  Patient Instructions: 1)  Please schedule a follow-up appointment in 1 month.exact 2)  It is important that you exercise regularly at least 20 minutes 5 times a week. If you develop chest pain, have severe difficulty breathing, or feel very tired , stop exercising immediately and seek medical attention. 3)  You need to lose weight. Consider a lower calorie diet and regular exercise.  4)  HbgA1C prior to visit, ICD-9:  pls just before visit 5)  stop tramadol, dose inc on lyrics, clonidine Prescriptions: PERCOCET 7.5-500 MG TABS (OXYCODONE-ACETAMINOPHEN) Take 1 tablet by mouth three times a day  #90 x 0   Entered and Authorized by:   Syliva Overman MD   Signed by:   Syliva Overman MD on 02/21/2010   Method used:   Historical   RxID:   [id number] LYRICA 75 MG CAPS (PREGABALIN) Take 1 capsule by mouth three times a day  #90 x 2   Entered and Authorized by:   Syliva Overman MD   Signed by:   Syliva Overman MD on 02/21/2010   Method used:   Printed then faxed to ...       [facility]* (retail)       [address]       [place], [place]  [address]       Ph: [phone]       Fax: [phone]   RxID:   [id number] LEVEMIR FLEXPEN 100 UNIT/ML SOLN (INSULIN DETEMIR) 60 units twice daily  #12,000 units x 3   Entered and Authorized by:   Syliva Overman MD   Signed by:   Syliva Overman MD on 02/21/2010   Method used:   Printed then faxed to ...       [facility]* (retail)       [address]       [place], [place]  [address]       Ph: [phone]       Fax: [phone]   RxID:   [id number] CLONIDINE HCL 0.3 MG TABS (CLONIDINE HCL) Take 1 tablet by mouth two times a day  #180 x 3   Entered and Authorized by:   Syliva Overman MD   Signed by:   Syliva Overman MD on 02/21/2010   Method used:   Printed then faxed to ...       [facility]* (retail)       [address]       [place], [place]  [address]       Ph: [phone]       Fax: [phone]   RxID:   [id number]   Laboratory Results   Blood Tests     Glucose (fasting): 132 mg/dL   (Normal Range: 07-371)

## 2010-12-03 NOTE — Letter (Signed)
Summary: labs  labs   Imported By: Curtis Sites 04/04/2010 10:59:25  _____________________________________________________________________  External Attachment:    Type:   Image     Comment:   External Document

## 2010-12-03 NOTE — Miscellaneous (Signed)
Summary: med change  Clinical Lists Changes  Medications: Changed medication from LANTUS SOLOSTAR 100 UNIT/ML SOLN (INSULIN GLARGINE) 100 units daily to LEVEMIR FLEXPEN 100 UNIT/ML SOLN (INSULIN DETEMIR) 100 units daily - Signed Rx of LEVEMIR FLEXPEN 100 UNIT/ML SOLN (INSULIN DETEMIR) 100 units daily;  #3100 x 2;  Signed;  Entered by: Everitt Amber LPN;  Authorized by: Syliva Overman MD;  Method used: Electronically to [facility]*, [address] [place], [place], [place]  [address], Ph: [phone], Fax: [phone]    Prescriptions: LEVEMIR FLEXPEN 100 UNIT/ML SOLN (INSULIN DETEMIR) 100 units daily  #3100 x 2   Entered by:   Everitt Amber LPN   Authorized by:   Syliva Overman MD   Signed by:   Everitt Amber LPN on 30/16/0109   Method used:   Electronically to        [facility]* (retail)       [address]       [place], [place]  [address]       Ph: [phone]       Fax: [phone]   RxID:   [id number]

## 2010-12-03 NOTE — Letter (Signed)
Summary: disability claim form  disability claim form   Imported By: Lind Guest 02/08/2010 15:59:31  _____________________________________________________________________  External Attachment:    Type:   Image     Comment:   External Document

## 2010-12-03 NOTE — Letter (Signed)
Summary: disabilty paper  disabilty paper   Imported By: Lind Guest 11/30/2009 13:56:21  _____________________________________________________________________  External Attachment:    Type:   Image     Comment:   External Document

## 2010-12-03 NOTE — Letter (Signed)
Summary: progress notes  progress notes   Imported By: Curtis Sites 04/04/2010 10:56:22  _____________________________________________________________________  External Attachment:    Type:   Image     Comment:   External Document

## 2010-12-03 NOTE — Progress Notes (Signed)
Summary: speak with nurse  Phone Note Call from Patient   Summary of Call: pt needs to talk with nurse about getting some rx faxed. [phone] Initial call taken by: Rudene Anda,  July 15, 2010 2:09 PM    Prescriptions: GLUCOTROL 10 MG TABS (GLIPIZIDE) take one tab by mouth two times a day  #60 x 0   Entered by:   Adella Hare LPN   Authorized by:   Syliva Overman MD   Signed by:   Adella Hare LPN on 09/81/1914   Method used:   Printed then faxed to ...       [facility] [address]* (retail)       [address]       [place], [place]  [address]       Ph: [phone]       Fax: [phone]   RxID:   [id number] SINGULAIR 10 MG TABS (MONTELUKAST SODIUM) Take 1 tablet by mouth once a day  #30 x 0   Entered by:   Adella Hare LPN   Authorized by:   Syliva Overman MD   Signed by:   Adella Hare LPN on 40/34/7425   Method used:   Printed then faxed to ...       [facility] [address]* (retail)       [address]       [place], [place]  [address]       Ph: [phone]       Fax: [phone]   RxID:   [id number] LYRICA 75 MG CAPS (PREGABALIN) Take 1 capsule by mouth three times a day  #90 x 0   Entered by:   Adella Hare LPN   Authorized by:   Syliva Overman MD   Signed by:   Adella Hare LPN on 25/42/7062   Method used:   Printed then faxed to ...       [facility] [address]* (retail)       [address]       [place], [place]  [address]       Ph: [phone]       Fax: [phone]   RxID:   [id number]

## 2010-12-03 NOTE — Miscellaneous (Signed)
  Clinical Lists Changes  Medications: Added new medication of LEVEMIR FLEXPEN 100 UNIT/ML SOLN (INSULIN DETEMIR) 65 units twice daily, new dose effective 04/22/2010 - Signed Removed medication of LEVEMIR FLEXPEN 100 UNIT/ML SOLN (INSULIN DETEMIR) 60 units twice daily - Signed Rx of LEVEMIR FLEXPEN 100 UNIT/ML SOLN (INSULIN DETEMIR) 65 units twice daily, new dose effective 04/22/2010;  #4000 units x 4;  Signed;  Entered by: Syliva Overman MD;  Authorized by: Syliva Overman MD;  Method used: Electronically to [facility]*, [address] [place], [place], [place]  [address], Ph: [phone], Fax: [phone]    Prescriptions: Enis Slipper 100 UNIT/ML SOLN (INSULIN DETEMIR) 65 units twice daily, new dose effective 04/22/2010  #4000 units x 4   Entered and Authorized by:   Syliva Overman MD   Signed by:   Syliva Overman MD on 04/21/2010   Method used:   Electronically to        [facility]* (retail)       [address]       [place], [place]  [address]       Ph: [phone]       Fax: [phone]   RxID:   [id number]  new dose of levemir, increase to 65 uniotes twice daily, pls stamp and fax in changed script to the pharmacy with a note thoso already sent electronically,a nd also notify the pt. He does not need allopurinol based on his uric acid level, thanks

## 2010-12-03 NOTE — Medication Information (Signed)
Summary: Tax adviser   Imported By: Lind Guest 01/18/2010 08:00:24  _____________________________________________________________________  External Attachment:    Type:   Image     Comment:   External Document

## 2010-12-03 NOTE — Miscellaneous (Signed)
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

## 2010-12-03 NOTE — Assessment & Plan Note (Signed)
Summary: office visit   Vital Signs:  Patient profile:   50 year old male Height:      70 inches Weight:      242 pounds BMI:     34.85 O2 Sat:      95 % Pulse rate:   85 / minute Pulse rhythm:   regular Resp:     16 per minute BP sitting:   148 / 82  (left arm)  Vitals Entered By: Everitt Amber LPN (April 18, 2010 9:12 AM)  Nutrition Counseling: Patient's BMI is greater than 25 and therefore counseled on weight management options. CC: For almost 2 weeks his right elbow has been really swollen and hurts really bad. He has never had gout but it runs in his family   Primary Care Provider:  Syliva Overman MD  CC:  For almost 2 weeks his right elbow has been really swollen and hurts really bad. He has never had gout but it runs in his family.  History of Present Illness: Reports  that he has been doing fairly well. Denies recent fever or chills. Denies sinus pressure, nasal congestion , ear pain or sore throat. Denies chest congestion, or cough productive of sputum. Denies chest pain, palpitations, PND, orthopnea or leg swelling. Denies abdominal pain, nausea, vomitting, diarrhea or constipation. Denies change in bowel movements or bloody stool. Denies dysuria , frequency, incontinence or hesitancy. He tests hios sugars regularly and reports an improvement, he has his record which substantiates this, fasting are generally under 140 Denies headaches, vertigo, seizures. Denies depression, anxiety or insomnia. Denies  rash, lesions, or itch.     Current Medications (verified): 1)  Aspirin 81 Mg  Tbec (Aspirin) .... One Tab By Mouth Once Daily 2)  Exforge 10-320 Mg Tabs (Amlodipine Besylate-Valsartan) .... Take 1 Tablet By Mouth Once A Day 3)  Glucotrol 10 Mg Tabs (Glipizide) .... Take One Tab By Mouth Two Times A Day 4)  Crestor 20 Mg Tabs (Rosuvastatin Calcium) .... Take One Tab By Mouth Once Daily 5)  Singulair 10 Mg Tabs (Montelukast Sodium) .... Take 1 Tablet By Mouth Once A  Day 6)  Cialis 20 Mg Tabs (Tadalafil) .... Uad 7)  Flexeril 10 Mg Tabs (Cyclobenzaprine Hcl) .... Take 1 Tablet By Mouth Two Times A Day 8)  Ranitidine Hcl 150 Mg Caps (Ranitidine Hcl) .... Take 1 Capsule By Mouth Two Times A Day 9)  Prodigy Blood Glucose Test  Strp (Glucose Blood) .... Twice Daily Testing 10)  Freestyle Test  Strp (Glucose Blood) .... Three Times A Day 11)  Benazepril Hcl 40 Mg Tabs (Benazepril Hcl) .... Take 1 Tablet By Mouth Once A Day 12)  Janumet 50-1000 Mg Tabs (Sitagliptin-Metformin Hcl) .... Take 1 Tablet By Mouth Two Times A Day 13)  Etodolac 500 Mg Tabs (Etodolac) .... Take 1 Tablet By Mouth Two Times A Day 14)  Pen Needles 5/16" 31g X 8 Mm Misc (Insulin Pen Needle) .... Once Daily Use 15)  Clonidine Hcl 0.3 Mg Tabs (Clonidine Hcl) .... Take 1 Tablet By Mouth Two Times A Day 16)  Levemir Flexpen 100 Unit/ml Soln (Insulin Detemir) .... 60 Units Twice Daily 17)  Lyrica 75 Mg Caps (Pregabalin) .... Take 1 Capsule By Mouth Three Times A Day 18)  Percocet 7.5-500 Mg Tabs (Oxycodone-Acetaminophen) .... Take 1 Tablet By Mouth Three Times A Day  Allergies (verified): 1)  ! Codeine  Review of Systems      See HPI Eyes:  Denies discharge, eye pain,  and red eye. MS:  Complains of joint pain, low back pain, and mid back pain; swollen painful right elbow x 2 weeks, no trauma, swelling has gone down, but still  present. pt has been non compliant with his narcs, he was unaware that he could /should collect a script between visits.. Psych:  Denies anxiety and depression. Endo:  tests  2 to 3 times daily, ranges from 70 to 230. Heme:  Denies abnormal bruising and bleeding. Allergy:  Denies hives or rash and itching eyes.  Physical Exam  General:  Well-developed,obese,in no acute distress; alert,appropriate and cooperative throughout examination HEENT: No facial asymmetry,  EOMI, No sinus tenderness, TM's Clear, oropharynx  pink and moist.   Chest: Clear to auscultation  bilaterally.  CVS: S1, S2, No murmurs, No S3.   Abd: Soft, Nontender.  MS: decreased ROM spine,adequate in  hips, shoulders and knees.Swollen right elbow, bony swelling, no fluctuance or warmth, mildly tender.  Ext: No edema.   CNS: CN 2-12 intact, power tone and sensation normal throughout.   Skin: Intact, no visible lesions or rashes.  Psych: Good eye contact, normal affect.  Memory intact, not anxious or depressed appearing.    Impression & Recommendations:  Problem # 1:  ELBOW PAIN, RIGHT (ICD-719.42) Assessment Comment Only  Future Orders: Orthopedic Referral (Ortho) ... 04/19/2010  Problem # 2:  DIABETES MELLITUS, TYPE II (ICD-250.00) Assessment: Comment Only  The following medications were removed from the medication list:    Benazepril Hcl 40 Mg Tabs (Benazepril hcl) .Marland Kitchen... Take 1 tablet by mouth once a day His updated medication list for this problem includes:    Aspirin 81 Mg Tbec (Aspirin) ..... One tab by mouth once daily    Exforge 10-320 Mg Tabs (Amlodipine besylate-valsartan) .Marland Kitchen... Take 1 tablet by mouth once a day    Glucotrol 10 Mg Tabs (Glipizide) .Marland Kitchen... Take one tab by mouth two times a day    Janumet 50-1000 Mg Tabs (Sitagliptin-metformin hcl) .Marland Kitchen... Take 1 tablet by mouth two times a day    Levemir Flexpen 100 Unit/ml Soln (Insulin detemir) .Marland KitchenMarland KitchenMarland KitchenMarland Kitchen 60 units twice daily    Benazepril Hcl 40 Mg Tabs (Benazepril hcl) .Marland Kitchen... Take 1 tablet by mouth once a day  Labs Reviewed: Creat: 0.74 (11/26/2009)     Last Eye Exam: diabetic retinopathy (06/06/2009) Reviewed HgBA1c results: 7.7 (12/26/2009)  6.8 (10/02/2009)  Problem # 3:  HYPERTENSION (ICD-401.9) Assessment: Unchanged  BP today: 148/82 Prior BP: 146/100 (02/21/2010)  Labs Reviewed: K+: 4.7 (11/26/2009) Creat: : 0.74 (11/26/2009)   Chol: 92 (11/26/2009)   HDL: 37 (11/26/2009)   LDL: 37 (11/26/2009)   TG: 89 (11/26/2009)  Problem # 4:  OBESITY (ICD-278.00) Assessment: Improved  Ht: 70 (04/18/2010)   Wt:  242 (04/18/2010)   BMI: 34.85 (04/18/2010)  Problem # 5:  HYPERTENSION (ICD-401.9)  Complete Medication List: 1)  Aspirin 81 Mg Tbec (Aspirin) .... One tab by mouth once daily 2)  Exforge 10-320 Mg Tabs (Amlodipine besylate-valsartan) .... Take 1 tablet by mouth once a day 3)  Glucotrol 10 Mg Tabs (Glipizide) .... Take one tab by mouth two times a day 4)  Crestor 20 Mg Tabs (Rosuvastatin calcium) .... Take one tab by mouth once daily 5)  Singulair 10 Mg Tabs (Montelukast sodium) .... Take 1 tablet by mouth once a day 6)  Cialis 20 Mg Tabs (Tadalafil) .... Uad 7)  Flexeril 10 Mg Tabs (Cyclobenzaprine hcl) .... Take 1 tablet by mouth two times a day 8)  Ranitidine Hcl  150 Mg Caps (Ranitidine hcl) .... Take 1 capsule by mouth two times a day 9)  Prodigy Blood Glucose Test Strp (Glucose blood) .... Twice daily testing 10)  Freestyle Test Strp (Glucose blood) .... Three times a day 11)  Janumet 50-1000 Mg Tabs (Sitagliptin-metformin hcl) .... Take 1 tablet by mouth two times a day 12)  Etodolac 500 Mg Tabs (Etodolac) .... Take 1 tablet by mouth two times a day 13)  Pen Needles 5/16" 31g X 8 Mm Misc (Insulin pen needle) .... Once daily use 14)  Levemir Flexpen 100 Unit/ml Soln (Insulin detemir) .... 60 units twice daily 15)  Lyrica 75 Mg Caps (Pregabalin) .... Take 1 capsule by mouth three times a day 16)  Percocet 7.5-500 Mg Tabs (Oxycodone-acetaminophen) .... Take 1 tablet by mouth two times a day 17)  Benazepril Hcl 40 Mg Tabs (Benazepril hcl) .... Take 1 tablet by mouth once a day 18)  Clonidine Hcl 0.3 Mg Tabs (Clonidine hcl) .... Take 1 tab by mouth at bedtime  Other Orders: T-Drug Screen-Urine, ea ([facility]) [id number])  Patient Instructions: 1)  f/u in 6 weeks. 2)  You will be referred to ortho about the swelling on your right elbow.Marland Kitchen 3)  New lower dose of clonidine is 1 at 9pm 4)  We will call with youour lab results Prescriptions: CLONIDINE HCL 0.3 MG TABS (CLONIDINE HCL) Take 1  tab by mouth at bedtime  #30 x 3   Entered and Authorized by:   Syliva Overman MD   Signed by:   Syliva Overman MD on 04/18/2010   Method used:   Printed then faxed to ...       [facility]* (retail)       [address]       [place], [place]  [address]       Ph: [phone]       Fax: [phone]   RxID:   [id number] BENAZEPRIL HCL 40 MG TABS (BENAZEPRIL HCL) Take 1 tablet by mouth once a day  #30 x 4   Entered and Authorized by:   Syliva Overman MD   Signed by:   Syliva Overman MD on 04/18/2010   Method used:   Electronically to        [facility]* (retail)       [address]       [place], [place]  [address]       Ph: [phone]       Fax: [phone]   RxID:   [id number]

## 2010-12-03 NOTE — Progress Notes (Signed)
Summary: medicines  Phone Note Call from Patient   Summary of Call: needs exforge and crestor and janumet at [facility] in [place] any medicine at [facility] transfer to [facility] leave the diabetic testing supllies at [facility] Initial call taken by: Lind Guest,  September 18, 2010 11:05 AM    Prescriptions: JANUMET 50-1000 MG TABS (SITAGLIPTIN-METFORMIN HCL) Take 1 tablet by mouth two times a day  #60 x 1   Entered by:   Adella Hare LPN   Authorized by:   Syliva Overman MD   Signed by:   Adella Hare LPN on 16/08/9603   Method used:   Electronically to        [facility] [address]* (retail)       [address]       [place], [place]  [address]       Ph: [phone]       Fax: [phone]   RxID:   [id number] CRESTOR 20 MG TABS (ROSUVASTATIN CALCIUM) take one tab by mouth once Monday, Wednesday and Friday  #12 x 1   Entered by:   Adella Hare LPN   Authorized by:   Syliva Overman MD   Signed by:   Adella Hare LPN on 08/30/2535   Method used:   Electronically to        [facility] [address]* (retail)       [address]       [place], [place]  [address]       Ph: [phone]       Fax: [phone]   RxID:   [id number] EXFORGE 10-320 MG TABS (AMLODIPINE BESYLATE-VALSARTAN) Take 1 tablet by mouth once a day  #30 x 1   Entered by:   Adella Hare LPN   Authorized by:   Syliva Overman MD   Signed by:   Adella Hare LPN on 32/35/5732   Method used:   Electronically to        [facility] [address]* (retail)       [address]       [place], [place]  [address]       Ph: [phone]       Fax: [phone]   RxID:   [id number]

## 2010-12-03 NOTE — Letter (Signed)
Summary: phone notes  phone notes   Imported By: Curtis Sites 04/04/2010 11:04:32  _____________________________________________________________________  External Attachment:    Type:   Image     Comment:   External Document

## 2010-12-03 NOTE — Letter (Signed)
Summary: insurance paper  insurance paper   Imported By: Lind Guest 07/23/2010 08:11:37  _____________________________________________________________________  External Attachment:    Type:   Image     Comment:   External Document

## 2010-12-03 NOTE — Progress Notes (Signed)
Summary: MEDICINE  Phone Note Call from Patient   Summary of Call: WANTING TO KNOW IS HIS RX READY AND THAT THE MEDICINE IS NOT LASTING LONG ENOUGH  PAIN IS GETTING WORSE CALL BACK AT [phone] ALSO PATIENT IS TAKING MORE THAN PRESCRIBED Initial call taken by: Lind Guest,  July 04, 2010 11:39 AM  Follow-up for Phone Call        he can geta dose inc this one time without oV io am writing the script if less than 1 week early hecan collect let him know needs ov before next refill to discuss furtherpain mx options opls  Follow-up by: Syliva Overman MD,  July 04, 2010 11:57 AM  Additional Follow-up for Phone Call Additional follow up Details #1::        patient aware Additional Follow-up by: Adella Hare LPN,  July 04, 2010 1:47 PM    New/Updated Medications: PERCOCET 7.5-500 MG TABS (OXYCODONE-ACETAMINOPHEN) Take 1 tablet by mouth three times a day Prescriptions: PERCOCET 7.5-500 MG TABS (OXYCODONE-ACETAMINOPHEN) Take 1 tablet by mouth three times a day  #90 x 0   Entered and Authorized by:   Syliva Overman MD   Signed by:   Syliva Overman MD on 07/04/2010   Method used:   Historical   RxID:   [id number]

## 2010-12-03 NOTE — Progress Notes (Signed)
  Phone Note From Pharmacy   Summary of Call: singulair not covered  Initial call taken by: Adella Hare LPN,  July 22, 2010 10:54 AM  Follow-up for Phone Call        let pt know and change to loratidine 10mg  one daily or certrizine 10mg  one dailly , with no ins each  cost $10 for 90 daysupplies, pls  Follow-up by: Syliva Overman MD,  July 22, 2010 11:45 AM    New/Updated Medications: LORATADINE 10 MG TABS (LORATADINE) one tab by mouth once daily discontinue singulair Prescriptions: LORATADINE 10 MG TABS (LORATADINE) one tab by mouth once daily discontinue singulair  #90 x 0   Entered by:   Adella Hare LPN   Authorized by:   Syliva Overman MD   Signed by:   Adella Hare LPN on 09/81/1914   Method used:   Electronically to        [facility] [address]* (retail)       [address]       [place], [place]  [address]       Ph: [phone]       Fax: [phone]   RxID:   [id number]

## 2010-12-03 NOTE — Letter (Signed)
Summary: demographic  demographic   Imported By: Curtis Sites 04/04/2010 11:00:27  _____________________________________________________________________  External Attachment:    Type:   Image     Comment:   External Document

## 2010-12-03 NOTE — Progress Notes (Signed)
  Phone Note Other Incoming   Caller: dr Hartford Maulden Summary of Call: form left re disability last week, pls fill in from previous forms Initial call taken by: Syliva Overman MD,  November 20, 2009 12:52 PM  Follow-up for Phone Call        completed as much of the form as i could, gave to doc for review Follow-up by: Worthy Keeler LPN,  November 22, 2009 9:37 AM

## 2010-12-03 NOTE — Letter (Signed)
Summary: xray  xray   Imported By: Curtis Sites 04/04/2010 10:57:03  _____________________________________________________________________  External Attachment:    Type:   Image     Comment:   External Document

## 2010-12-03 NOTE — Assessment & Plan Note (Signed)
Summary: office visit   Vital Signs:  Patient profile:   50 year old male Height:      70.5 inches Weight:      254.75 pounds BMI:     36.17 O2 Sat:      96 % Pulse rate:   95 / minute Pulse rhythm:   regular Resp:     16 per minute BP sitting:   140 / 90  (left arm) Cuff size:   large  Vitals Entered By: Everitt Amber LPN (December 26, 2009 8:54 AM)  Nutrition Counseling: Patient's BMI is greater than 25 and therefore counseled on weight management options. CC: Follow up chronic problems   Primary Care Provider:  Syliva Overman MD  CC:  Follow up chronic problems.  History of Present Illness: Reports  thathe has been e doing well. Denies recent fever or chills. Denies sinus pressure, nasal congestion , ear pain or sore throat. Denies chest congestion, or cough productive of sputum. Denies chest pain, palpitations, PND, orthopnea or leg swelling. Denies abdominal pain, nausea, vomitting, diarrhea or constipation. Denies change in bowel movements or bloody stool. Denies dysuria , frequency, incontinence or hesitancy.c/o ed Continues to experience significant bac pain with reduced mobility and significant debility. Denies headaches, vertigo, seizures. Denies depression, anxiety or insomnia. Denies  rash, lesions, or itch.     Current Medications (verified): 1)  Janumet 50-1000 Mg  Tabs (Sitagliptin-Metformin Hcl) .... One Tab By Mouth Two Times A Day 2)  Aspirin 81 Mg  Tbec (Aspirin) .... One Tab By Mouth Once Daily 3)  Exforge 10-320 Mg Tabs (Amlodipine Besylate-Valsartan) .... Take 1 Tablet By Mouth Once A Day 4)  Glucotrol 10 Mg Tabs (Glipizide) .... Take One Tab By Mouth Two Times A Day 5)  Crestor 20 Mg Tabs (Rosuvastatin Calcium) .... Take One Tab By Mouth Once Daily 6)  Lyrica 50 Mg Caps (Pregabalin) .... Take 1 Tablet By Mouth Three Times A Day 7)  Singulair 10 Mg Tabs (Montelukast Sodium) .... Take 1 Tablet By Mouth Once A Day 8)  Lantus 100 Unit/ml Soln  (Insulin Glargine) .... 75 Units Subcutaneously Qd 9)  Cialis 20 Mg Tabs (Tadalafil) .... Uad 10)  Flexeril 10 Mg Tabs (Cyclobenzaprine Hcl) .... Take 1 Tablet By Mouth Two Times A Day 11)  Clonidine Hcl 0.3 Mg Tabs (Clonidine Hcl) .... Take 1 Tab By Mouth At Bedtime 12)  Ranitidine Hcl 150 Mg Caps (Ranitidine Hcl) .... Take 1 Capsule By Mouth Two Times A Day 13)  Prodigy Blood Glucose Test  Strp (Glucose Blood) .... Twice Daily Testing 14)  Ibuprofen 800 Mg Tabs (Ibuprofen) .... Take 1 Tablet By Mouth Three Times A Day 15)  Duragesic-25 25 Mcg/hr Pt72 (Fentanyl) .... Change Patch Every 72 Hours 16)  Freestyle Test  Strp (Glucose Blood) .... Three Times A Day 17)  Benazepril Hcl 40 Mg Tabs (Benazepril Hcl) .... Take 1 Tablet By Mouth Once A Day 18)  Tramadol Hcl 50 Mg Tabs (Tramadol Hcl) .... Take 1 Tablet By Mouth Three Times A Day 19)  Metformin Hcl 1000 Mg Tabs (Metformin Hcl) .... One Tab By Mouth Two Times A Day  Discontinue Actos  Allergies (verified): 1)  ! Codeine  Past History:  Past medical, surgical, family and social histories (including risk factors) reviewed, and no changes noted (except as noted below).  Past Medical History: Reviewed history from 02/15/2008 and no changes required. Current Problems:  ALLERGIC RHINITIS (ICD-477.9) DEPRESSION (ICD-311) HYPERLIPIDEMIA (ICD-272.4) OBESITY (ICD-278.00) HYPERTENSION (ICD-401.9)  DIABETES MELLITUS, TYPE I (ICD-250.01) BACK PAIN (ICD-724.5)  Past Surgical History: Reviewed history from 02/15/2008 and no changes required. Cyst removed from left lung (1995) Appendectomy (1975) Back surgery (2008)  Family History: Reviewed history from 02/15/2008 and no changes required. Mother HTN Dad HTN Sister two healthy Brother 3 HTN in 1  Social History: Reviewed history from 04/03/2009 and no changes required. disabled Divorced Three children  Alcohol use-no Drug use-no Quit nicotine 11/21/2009  Review of Systems       See HPI Eyes:  Denies blurring, discharge, and red eye. GU:  Complains of erectile dysfunction. MS:  Complains of low back pain and mid back pain. Endo:  Denies cold intolerance, excessive hunger, excessive thirst, excessive urination, heat intolerance, polyuria, and weight change; tesys twice daily ansd values are often in range. Heme:  Denies abnormal bruising and bleeding. Allergy:  Complains of seasonal allergies; denies hives or rash and sneezing.  Physical Exam  General:  Well-developed,obese,in no acute distress; alert,appropriate and cooperative throughout examination HEENT: No facial asymmetry,  EOMI, No sinus tenderness, TM's Clear, oropharynx  pink and moist.   Chest: Clear to auscultation bilaterally.  CVS: S1, S2, No murmurs, No S3.   Abd: Soft, Nontender.  MS: decreased ROM spine,adequate in  hips, shoulders and knees.  Ext: No edema.   CNS: CN 2-12 intact, power tone and sensation normal throughout.   Skin: Intact, no visible lesions or rashes.  Psych: Good eye contact, normal affect.  Memory intact, not anxious or depressed appearing.    Impression & Recommendations:  Problem # 1:  ERECTILE DYSFUNCTION, ORGANIC (ICD-607.84) Assessment Deteriorated  His updated medication list for this problem includes:    Cialis 20 Mg Tabs (Tadalafil) ..... Uad  Problem # 2:  DIABETES MELLITUS, TYPE II (ICD-250.00) Assessment: Comment Only  The following medications were removed from the medication list:    Janumet 50-1000 Mg Tabs (Sitagliptin-metformin hcl) ..... One tab by mouth two times a day    Lantus 100 Unit/ml Soln (Insulin glargine) .Marland KitchenMarland KitchenMarland KitchenMarland Kitchen 75 units subcutaneously qd    Lantus Solostar 100 Unit/ml Soln (Insulin glargine) ..... Use as directed    Metformin Hcl 1000 Mg Tabs (Metformin hcl) ..... One tab by mouth two times a day  discontinue actos His updated medication list for this problem includes:    Aspirin 81 Mg Tbec (Aspirin) ..... One tab by mouth once daily     Exforge 10-320 Mg Tabs (Amlodipine besylate-valsartan) .Marland Kitchen... Take 1 tablet by mouth once a day    Glucotrol 10 Mg Tabs (Glipizide) .Marland Kitchen... Take one tab by mouth two times a day    Benazepril Hcl 40 Mg Tabs (Benazepril hcl) .Marland Kitchen... Take 1 tablet by mouth once a day    Lantus Solostar 100 Unit/ml Soln (Insulin glargine) .Marland KitchenMarland KitchenMarland KitchenMarland Kitchen 100 units daily    Janumet 50-1000 Mg Tabs (Sitagliptin-metformin hcl) .Marland Kitchen... Take 1 tablet by mouth two times a day  Orders: T- Hemoglobin A1C ([address])  Labs Reviewed: Creat: 0.74 (11/26/2009)     Last Eye Exam: diabetic retinopathy (06/06/2009) Reviewed HgBA1c results: 6.8 (10/02/2009)  7.7 (07/11/2009)  Problem # 3:  ALLERGIC RHINITIS (ICD-477.9) Assessment: Unchanged  Problem # 4:  OBESITY (ICD-278.00) Assessment: Unchanged  Ht: 70.5 (12/26/2009)   Wt: 254.75 (12/26/2009)   BMI: 36.17 (12/26/2009)  Problem # 5:  HYPERTENSION (ICD-401.9) Assessment: Improved  The following medications were removed from the medication list:    Clonidine Hcl 0.3 Mg Tabs (Clonidine hcl) .Marland Kitchen... Take 1 tab by mouth at bedtime  His updated medication list for this problem includes:    Exforge 10-320 Mg Tabs (Amlodipine besylate-valsartan) .Marland Kitchen... Take 1 tablet by mouth once a day    Benazepril Hcl 40 Mg Tabs (Benazepril hcl) .Marland Kitchen... Take 1 tablet by mouth once a day    Clonidine Hcl 0.3 Mg Tabs (Clonidine hcl) ..... One and a half tablets at bedtime  BP today: 140/90 Prior BP: 140/100 (10/30/2009)  Labs Reviewed: K+: 4.7 (11/26/2009) Creat: : 0.74 (11/26/2009)   Chol: 92 (11/26/2009)   HDL: 37 (11/26/2009)   LDL: 37 (11/26/2009)   TG: 89 (11/26/2009)  Problem # 6:  BACK PAIN (ICD-724.5) Assessment: Deteriorated  The following medications were removed from the medication list:    Ibuprofen 800 Mg Tabs (Ibuprofen) .Marland Kitchen... Take 1 tablet by mouth three times a day    Duragesic-25 25 Mcg/hr Pt72 (Fentanyl) .Marland Kitchen... Change patch every 72 hours His updated medication list for this  problem includes:    Aspirin 81 Mg Tbec (Aspirin) ..... One tab by mouth once daily    Flexeril 10 Mg Tabs (Cyclobenzaprine hcl) .Marland Kitchen... Take 1 tablet by mouth two times a day    Tramadol Hcl 50 Mg Tabs (Tramadol hcl) .Marland Kitchen... Take 1 tablet by mouth three times a day    Etodolac 500 Mg Tabs (Etodolac) .Marland Kitchen... Take 1 tablet by mouth two times a day  Complete Medication List: 1)  Aspirin 81 Mg Tbec (Aspirin) .... One tab by mouth once daily 2)  Exforge 10-320 Mg Tabs (Amlodipine besylate-valsartan) .... Take 1 tablet by mouth once a day 3)  Glucotrol 10 Mg Tabs (Glipizide) .... Take one tab by mouth two times a day 4)  Crestor 20 Mg Tabs (Rosuvastatin calcium) .... Take one tab by mouth once daily 5)  Lyrica 50 Mg Caps (Pregabalin) .... Take 1 tablet by mouth three times a day 6)  Singulair 10 Mg Tabs (Montelukast sodium) .... Take 1 tablet by mouth once a day 7)  Cialis 20 Mg Tabs (Tadalafil) .... Uad 8)  Flexeril 10 Mg Tabs (Cyclobenzaprine hcl) .... Take 1 tablet by mouth two times a day 9)  Ranitidine Hcl 150 Mg Caps (Ranitidine hcl) .... Take 1 capsule by mouth two times a day 10)  Prodigy Blood Glucose Test Strp (Glucose blood) .... Twice daily testing 11)  Freestyle Test Strp (Glucose blood) .... Three times a day 12)  Benazepril Hcl 40 Mg Tabs (Benazepril hcl) .... Take 1 tablet by mouth once a day 13)  Tramadol Hcl 50 Mg Tabs (Tramadol hcl) .... Take 1 tablet by mouth three times a day 14)  Lantus Solostar 100 Unit/ml Soln (Insulin glargine) .Marland Kitchen.. 100 units daily 15)  Janumet 50-1000 Mg Tabs (Sitagliptin-metformin hcl) .... Take 1 tablet by mouth two times a day 16)  Clonidine Hcl 0.3 Mg Tabs (Clonidine hcl) .... One and a half tablets at bedtime 17)  Etodolac 500 Mg Tabs (Etodolac) .... Take 1 tablet by mouth two times a day  Other Orders: T-CBC w/Diff ([address])  Patient Instructions: 1)  F/U to 6 to 8 weeks 2)  pls record you sugars and bring to the next OV . 3)  Congrats on  quitting smoking 4)  CBC w/ Diff prior to visit, ICD-9:, chem 7 5)  HbgA1C prior to visit, ICD-9:  today 6)  It is important that you exercise regularly at least 20 minutes 5 times a week. If you develop chest pain, have severe difficulty breathing, or feel very tired , stop exercising immediately  and seek medical attention. 7)  You need to lose weight. Consider a lower calorie diet and regular exercise.  8)  STOP actos. 9)  Stop Metformin until you finish taking all of the janumet. 10)  New dose clonidine0.3mg  one and a hald at bedtime. 11)  Benadryl for allergie. 12)  New med for pain, ok to use tramadol also Prescriptions: ETODOLAC 500 MG TABS (ETODOLAC) Take 1 tablet by mouth two times a day  #60 x 4   Entered and Authorized by:   Syliva Overman MD   Signed by:   Syliva Overman MD on 12/26/2009   Method used:   Electronically to        [facility]* (retail)       [address]       [place], [place]  [address]       Ph: [phone]       Fax: [phone]   RxID:   [id number] CLONIDINE HCL 0.3 MG TABS (CLONIDINE HCL) one and a half tablets at bedtime  #45 x 4   Entered and Authorized by:   Syliva Overman MD   Signed by:   Syliva Overman MD on 12/26/2009   Method used:   Electronically to        [facility] (retail)       [address]       [place], [place]  [address]       Ph: [phone]       Fax: [phone]   RxID:   [id number] JANUMET 50-1000 MG TABS (SITAGLIPTIN-METFORMIN HCL) Take 1 tablet by mouth two times a day  #60 x 4   Entered and Authorized by:   Syliva Overman MD   Signed by:   Syliva Overman MD on 12/26/2009   Method used:   Historical   RxID:   [id number] LANTUS SOLOSTAR 100 UNIT/ML SOLN (INSULIN GLARGINE) 100 units daily  #3100 units x 4   Entered and Authorized by:   Syliva Overman MD   Signed by:   Syliva Overman MD on 12/26/2009   Method used:   Printed then faxed to  ...       [facility]* (retail)       [address]       [place], [place]  [address]       Ph: [phone]       Fax: [phone]   RxID:   [id number]

## 2010-12-03 NOTE — Medication Information (Signed)
Summary: Tax adviser   Imported By: Lind Guest 11/07/2009 10:14:56  _____________________________________________________________________  External Attachment:    Type:   Image     Comment:   External Document

## 2010-12-03 NOTE — Progress Notes (Signed)
Summary: ortopedic  ortopedic   Imported By: Lind Guest 05/02/2010 15:54:51  _____________________________________________________________________  External Attachment:    Type:   Image     Comment:   External Document

## 2010-12-03 NOTE — Progress Notes (Signed)
Summary: LANTUS PEN  Phone Note Call from Patient   Summary of Call: NEEDS HIS LANTUS SOLAR STYLE PENS CALLED IN AT [facility] IN [place] Initial call taken by: Lind Guest,  November 20, 2009 1:21 PM  Follow-up for Phone Call        Rx Called In Follow-up by: Worthy Keeler LPN,  November 20, 2009 4:21 PM    New/Updated Medications: LANTUS SOLOSTAR 100 UNIT/ML SOLN (INSULIN GLARGINE) use as directed Prescriptions: LANTUS SOLOSTAR 100 UNIT/ML SOLN (INSULIN GLARGINE) use as directed  #1 mnth x 4   Entered by:   Worthy Keeler LPN   Authorized by:   Syliva Overman MD   Signed by:   Worthy Keeler LPN on 16/08/9603   Method used:   Electronically to        [facility]* (retail)       [address]       [place], [place]  [address]       Ph: [phone]       Fax: [phone]   RxID:   [id number]

## 2010-12-03 NOTE — Letter (Signed)
Summary: history and physical  history and physical   Imported By: Curtis Sites 04/04/2010 10:54:06  _____________________________________________________________________  External Attachment:    Type:   Image     Comment:   External Document

## 2010-12-03 NOTE — Progress Notes (Signed)
Summary: PAPER  Phone Note Call from Patient   Summary of Call: PAPER FOR DISABILITY TO FILL OUT ON HIS HOUSE CALL BACK AT [phone] TO LET HIM KNOW Initial call taken by: Lind Guest,  February 07, 2010 8:02 AM  Follow-up for Phone Call        papers available for pick up front Follow-up by: Adella Hare LPN,  February 08, 2010 2:05 PM  Additional Follow-up for Phone Call Additional follow up Details #1::        patient aware Additional Follow-up by: Adella Hare LPN,  February 08, 2010 2:06 PM

## 2010-12-03 NOTE — Progress Notes (Signed)
Summary: PAPERS  Phone Note Call from Patient   Summary of Call: WONDERING ABOUT HIS PAPERS Initial call taken by: Lind Guest,  November 26, 2009 9:00 AM  Follow-up for Phone Call        completed and in your boxd, he needs to complete an entire side on his own, pls show him where when he comes to get this Follow-up by: Syliva Overman MD,  November 30, 2009 4:47 AM  Additional Follow-up for Phone Call Additional follow up Details #1::        PATIENT AWARE Additional Follow-up by: Lind Guest,  November 30, 2009 9:58 AM

## 2010-12-03 NOTE — Progress Notes (Signed)
  Phone Note From Pharmacy   Caller: [facility] [place] Summary of Call: lyrica not covered Initial call taken by: Adella Hare LPN,  July 22, 2010 11:57 AM  Follow-up for Phone Call        let pt know, I will be sending in gabapentin in its place pls stamp write the change and fax to correct pharmacy Follow-up by: Syliva Overman MD,  July 22, 2010 12:11 PM  Additional Follow-up for Phone Call Additional follow up Details #1::        patient aware and med sent Additional Follow-up by: Adella Hare LPN,  July 22, 2010 12:17 PM    New/Updated Medications: GABAPENTIN 300 MG CAPS (GABAPENTIN) Take 1 capsule by mouth two times a day Prescriptions: GABAPENTIN 300 MG CAPS (GABAPENTIN) Take 1 capsule by mouth two times a day  #60 x 3   Entered and Authorized by:   Syliva Overman MD   Signed by:   Syliva Overman MD on 07/22/2010   Method used:   Printed then faxed to ...       [facility]* (retail)       [address]       [place], [place]  [address]       Ph: [phone]       Fax: [phone]   RxID:   [id number]

## 2010-12-03 NOTE — Medication Information (Signed)
Summary: Tax adviser   Imported By: Lind Guest 07/25/2010 15:47:55  _____________________________________________________________________  External Attachment:    Type:   Image     Comment:   External Document

## 2010-12-03 NOTE — Miscellaneous (Signed)
Summary: NARC REFILL  Clinical Lists Changes  Medications: Rx of PERCOCET 7.5-500 MG TABS (OXYCODONE-ACETAMINOPHEN) Take 1 tablet by mouth two times a day;  #60 x 0;  Signed;  Entered by: Everitt Amber LPN;  Authorized by: Syliva Overman MD;  Method used: Handwritten    Prescriptions: PERCOCET 7.5-500 MG TABS (OXYCODONE-ACETAMINOPHEN) Take 1 tablet by mouth two times a day  #60 x 0   Entered by:   Everitt Amber LPN   Authorized by:   Syliva Overman MD   Signed by:   Everitt Amber LPN on 16/08/9603   Method used:   Handwritten   RxID:   [id number]

## 2010-12-03 NOTE — Miscellaneous (Signed)
Summary: NARC REFILL  Clinical Lists Changes  Medications: Rx of PERCOCET 7.5-500 MG TABS (OXYCODONE-ACETAMINOPHEN) Take 1 tablet by mouth three times a day;  #90 x 0;  Signed;  Entered by: Everitt Amber LPN;  Authorized by: Syliva Overman MD;  Method used: Handwritten    Prescriptions: PERCOCET 7.5-500 MG TABS (OXYCODONE-ACETAMINOPHEN) Take 1 tablet by mouth three times a day  #90 x 0   Entered by:   Everitt Amber LPN   Authorized by:   Syliva Overman MD   Signed by:   Everitt Amber LPN on 16/08/9603   Method used:   Handwritten   RxID:   [id number]

## 2010-12-03 NOTE — Letter (Signed)
Summary: consults  consults   Imported By: Curtis Sites 04/04/2010 11:03:34  _____________________________________________________________________  External Attachment:    Type:   Image     Comment:   External Document

## 2010-12-03 NOTE — Medication Information (Signed)
Summary: Tax adviser   Imported By: Lind Guest 05/02/2010 08:08:45  _____________________________________________________________________  External Attachment:    Type:   Image     Comment:   External Document

## 2010-12-03 NOTE — Progress Notes (Signed)
  Phone Note Other Incoming   Caller: [facility] Summary of Call: will not cover pt needs to go back tometformin 1000mg  one twice dailhy on completion of current actos 45mg , they will not cover it, notify pt, send d/c order for actos to his pharmacy and erx metformin 1000mg  one twice daily #60 refill4 pls Initial call taken by: Syliva Overman MD,  December 05, 2009 7:24 AM  Follow-up for Phone Call        called patient, unavailable Follow-up by: Worthy Keeler LPN,  December 05, 2009 9:56 AM  Additional Follow-up for Phone Call Additional follow up Details #1::        patient aware, med change sent Additional Follow-up by: Worthy Keeler LPN,  December 05, 2009 10:08 AM    New/Updated Medications: METFORMIN HCL 1000 MG TABS (METFORMIN HCL) one tab by mouth two times a day  discontinue actos Prescriptions: METFORMIN HCL 1000 MG TABS (METFORMIN HCL) one tab by mouth two times a day  discontinue actos  #60 x 4   Entered by:   Worthy Keeler LPN   Authorized by:   Syliva Overman MD   Signed by:   Worthy Keeler LPN on 16/08/9603   Method used:   Electronically to        [facility]* (retail)       [address]       [place], [place]  [address]       Ph: [phone]       Fax: [phone]   RxID:   [id number]

## 2010-12-03 NOTE — Medication Information (Signed)
Summary: Tax adviser   Imported By: Lind Guest 07/25/2010 15:47:31  _____________________________________________________________________  External Attachment:    Type:   Image     Comment:   External Document

## 2010-12-03 NOTE — Assessment & Plan Note (Signed)
Summary: office visit   Vital Signs:  Patient profile:   50 year old male Height:      70 inches Weight:      235.25 pounds BMI:     33.88 O2 Sat:      94 % on Room air Pulse rate:   87 / minute Pulse rhythm:   regular Resp:     16 per minute BP sitting:   150 / 92  (left arm)  Vitals Entered By: Mauricia Area CMA (September 05, 2010 10:15 AM)  Nutrition Counseling: Patient's BMI is greater than 25 and therefore counseled on weight management options.  O2 Flow:  Room air CC: Joints hurting. Wrists, knees, ankles and back   Primary Care Kayela Humphres:  Syliva Overman MD  CC:  Joints hurting. Wrists, knees, and ankles and back.  History of Present Illness: Reports  thathe has been doing fairly well, except for increased back and joint pain, which is not uncommon at this time of the year. Denies recent fever or chills. Denies sinus pressure, nasal congestion , ear pain or sore throat. Denies chest congestion, or cough productive of sputum. Denies chest pain, palpitations, PND, orthopnea or leg swelling. Denies abdominal pain, nausea, vomitting, diarrhea or constipation. Denies change in bowel movements or bloody stool. Denies dysuria , frequency, incontinence or hesitancy.  Denies headaches, vertigo, seizures. Denies depression, anxiety or insomnia. Denies  rash, lesions, or itch.     Preventive Screening-Counseling & Management  Alcohol-Tobacco     Smoking Cessation Counseling: yes  Current Medications (verified): 1)  Aspirin 81 Mg  Tbec (Aspirin) .... One Tab By Mouth Once Daily 2)  Exforge 10-320 Mg Tabs (Amlodipine Besylate-Valsartan) .... Take 1 Tablet By Mouth Once A Day 3)  Glucotrol 10 Mg Tabs (Glipizide) .... Take One Tab By Mouth Two Times A Day 4)  Crestor 20 Mg Tabs (Rosuvastatin Calcium) .... Take One Tab By Mouth Once Monday, Wednesday and Friday 5)  Cialis 20 Mg Tabs (Tadalafil) .... Use As Directed 6)  Flexeril 10 Mg Tabs (Cyclobenzaprine Hcl) .... Take  1 Tablet By Mouth Two Times A Day As Needed 7)  Ranitidine Hcl 150 Mg Caps (Ranitidine Hcl) .... Take 1 Capsule By Mouth Two Times A Day 8)  Prodigy Blood Glucose Test  Strp (Glucose Blood) .... Twice Daily Testing 9)  Freestyle Test  Strp (Glucose Blood) .... Three Times A Day 10)  Janumet 50-1000 Mg Tabs (Sitagliptin-Metformin Hcl) .... Take 1 Tablet By Mouth Two Times A Day 11)  Etodolac 500 Mg Tabs (Etodolac) .... Take 1 Tablet By Mouth Two Times A Day 12)  Pen Needles 5/16" 31g X 8 Mm Misc (Insulin Pen Needle) .... Once Daily Use 13)  Benazepril Hcl 40 Mg Tabs (Benazepril Hcl) .... Take 1 Tablet By Mouth Once A Day 14)  Levemir Flexpen 100 Unit/ml Soln (Insulin Detemir) .... 65 Units Twice Daily, New Dose Effective 04/22/2010 15)  Percocet 7.5-500 Mg Tabs (Oxycodone-Acetaminophen) .... Take 1 Tablet By Mouth Three Times A Day 16)  Loratadine 10 Mg Tabs (Loratadine) .... One Tab By Mouth Once Daily Discontinue Singulair 17)  Neurontin 600 Mg Tabs (Gabapentin) .... Take 1 Tablet By Mouth Three Times A Day 18)  Clonidine Hcl 0.3 Mg Tabs (Clonidine Hcl) .... One and A Half At Bedtime  Allergies (verified): 1)  ! Codeine  Past History:  Past medical, surgical, family and social histories (including risk factors) reviewed, and no changes noted (except as noted below).  Past Medical  History: Reviewed history from 02/15/2008 and no changes required. Current Problems:  ALLERGIC RHINITIS (ICD-477.9) DEPRESSION (ICD-311) HYPERLIPIDEMIA (ICD-272.4) OBESITY (ICD-278.00) HYPERTENSION (ICD-401.9) DIABETES MELLITUS, TYPE I (ICD-250.01) BACK PAIN (ICD-724.5)  Past Surgical History: Reviewed history from 06/04/2010 and no changes required. Cyst removed from left lung (1995) Appendectomy (6644) Back surgery (2008) July 2011-Right arm surgery to remove bursitis pocket and bone spur  Family History: Reviewed history from 02/15/2008 and no changes required. Mother HTN Dad HTN Sister two  healthy Brother 3 HTN in 1  Social History: Reviewed history from 12/26/2009 and no changes required. disabled Divorced Four children, youngest is 64 months in oct , 2011  Alcohol use-no Drug use-no Quit nicotine 11/21/2009  Review of Systems      See HPI General:  Complains of fatigue. Eyes:  Denies discharge and red eye. GU:  Complains of erectile dysfunction; requests referral for vasectomy if covered, he will check and call back. MS:  Complains of joint pain, low back pain, mid back pain, and stiffness. Neuro:  Complains of numbness. Endo:  Denies excessive thirst and excessive urination; reports fluctuations in blood sugar with a few episodes of hpoglycemia, often feels as though he needs to eat to keep his sugars up. Heme:  Denies abnormal bruising and bleeding. Allergy:  Complains of seasonal allergies.  Physical Exam  General:  Well-developed,well-nourished,in no acute distress; alert,appropriate and cooperative throughout examination HEENT: No facial asymmetry,  EOMI, No sinus tenderness, TM's Clear, oropharynx  pink and moist.   Chest: Clear to auscultation bilaterally.  CVS: S1, S2, No murmurs, No S3.   Abd: Soft, Nontender.  MS: decreased  ROM spine,adequate in  hips, shoulders and knees.  Ext: No edema.   CNS: CN 2-12 intact, power tone and sensation normal throughout.   Skin: Intact, no visible lesions or rashes.  Psych: Good eye contact, normal affect.  Memory intact, not anxious or depressed appearing.   Diabetes Management Exam:    Foot Exam (with socks and/or shoes not present):       Sensory-Monofilament:          Left foot: diminished          Right foot: diminished       Inspection:          Left foot: normal          Right foot: normal       Nails:          Left foot: normal          Right foot: normal   Impression & Recommendations:  Problem # 1:  NICOTINE ADDICTION (ICD-305.1) Assessment Unchanged  Encouraged smoking cessation and  discussed different methods for smoking cessation.   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 One tab by mouth once daily    Exforge 10-320 Mg Tabs (Amlodipine besylate-valsartan) .Marland Kitchen... Take 1 tablet by mouth once a day    Glucotrol 10 Mg Tabs (Glipizide) .Marland Kitchen... Take one tab by mouth two times a day    Janumet 50-1000 Mg Tabs (Sitagliptin-metformin hcl) .Marland Kitchen... Take 1 tablet by mouth two times a day    Benazepril Hcl 40 Mg Tabs (Benazepril hcl) .Marland Kitchen... Take 1 tablet by mouth once a day    Levemir Flexpen 100 Unit/ml Soln (Insulin detemir) .Marland KitchenMarland KitchenMarland KitchenMarland Kitchen  65 units twice daily, new dose effective 04/22/2010  Orders: T- Hemoglobin A1C ([id number]) Patient advised to reduce carbs and sweets, commit to regular physical activity, take meds as prescribed, test blood sugars as directed, and attempt to lose weight , to improve blood sugar control.  Labs Reviewed: Creat: 0.73 (07/18/2010)     Last Eye Exam: diabetic retinopathy (06/06/2009) Reviewed HgBA1c results: 7.5 (07/18/2010)  7.7 (04/18/2010)  Problem # 4:  HYPERLIPIDEMIA (ICD-272.4) Assessment: Comment Only  His updated medication list for this problem includes:    Crestor 20 Mg Tabs (Rosuvastatin calcium) .Marland Kitchen... Take one tab by mouth once monday, wednesday and friday  Orders: T-Hepatic Function [phone]) T-Lipid Profile [phone]) Low fat dietdiscussed and encouraged  Labs Reviewed: SGOT: 25 (07/18/2010)   SGPT: 38 (07/18/2010)   HDL:33 (07/18/2010), 37 (11/26/2009)  LDL:36 (07/18/2010), 37 (11/26/2009)  Chol:81 (07/18/2010), 92 (11/26/2009)  Trig:62 (07/18/2010), 89 (11/26/2009)  Problem # 5:  HYPERTENSION (ICD-401.9) Assessment: Unchanged  The following medications were removed from the medication list:     Clonidine Hcl 0.3 Mg Tabs (Clonidine hcl) ..... One and a half at bedtime His updated medication list for this problem includes:    Exforge 10-320 Mg Tabs (Amlodipine besylate-valsartan) .Marland Kitchen... Take 1 tablet by mouth once a day    Benazepril Hcl 40 Mg Tabs (Benazepril hcl) .Marland Kitchen... Take 1 tablet by mouth once a day    Clonidine Hcl 0.3 Mg Tabs (Clonidine hcl) ..... One half tablet at 9am, and one and a half tablet at 9 pm  Orders: T-Basic Metabolic Panel [id number]) Medicare Electronic Prescription [id number])  BP today: 150/92 Prior BP: 150/82 (07/25/2010)  Labs Reviewed: K+: 4.5 (07/18/2010) Creat: : 0.73 (07/18/2010)   Chol: 81 (07/18/2010)   HDL: 33 (07/18/2010)   LDL: 36 (07/18/2010)   TG: 62 (07/18/2010)  Complete Medication List: 1)  Aspirin 81 Mg Tbec (Aspirin) .... One tab by mouth once daily 2)  Exforge 10-320 Mg Tabs (Amlodipine besylate-valsartan) .... Take 1 tablet by mouth once a day 3)  Glucotrol 10 Mg Tabs (Glipizide) .... Take one tab by mouth two times a day 4)  Crestor 20 Mg Tabs (Rosuvastatin calcium) .... Take one tab by mouth once monday, wednesday and friday 5)  Cialis 20 Mg Tabs (Tadalafil) .... Use as directed 6)  Flexeril 10 Mg Tabs (Cyclobenzaprine hcl) .... Take 1 tablet by mouth two times a day as needed 7)  Ranitidine Hcl 150 Mg Caps (Ranitidine hcl) .... Take 1 capsule by mouth two times a day 8)  Prodigy Blood Glucose Test Strp (Glucose blood) .... Twice daily testing 9)  Freestyle Test Strp (Glucose blood) .... Three times a day 10)  Janumet 50-1000 Mg Tabs (Sitagliptin-metformin hcl) .... Take 1 tablet by mouth two times a day 11)  Etodolac 500 Mg Tabs (Etodolac) .... Take 1 tablet by mouth two times a day 12)  Pen Needles 5/16" 31g X 8 Mm Misc (Insulin pen needle) .... Once daily use 13)  Benazepril Hcl 40 Mg Tabs (Benazepril hcl) .... Take 1 tablet by mouth once a day 14)  Levemir Flexpen 100 Unit/ml Soln (Insulin detemir) .... 65 units twice daily, new  dose effective 04/22/2010 15)  Percocet 7.5-500 Mg Tabs (Oxycodone-acetaminophen) .... Take 1 tablet by mouth three times a day 16)  Loratadine 10 Mg Tabs (Loratadine) .... One tab by mouth once daily discontinue singulair 17)  Neurontin 600 Mg Tabs (Gabapentin) .... Take 1 tablet by mouth three times a day 18)  Clonidine Hcl 0.3 Mg  Tabs (Clonidine hcl) .... One half tablet at 9am, and one and a half tablet at 9 pm  Other Orders: T-TSH ([id number])  Patient Instructions: 1)  F/U in mid January. 2)  Tobacco is very bad for your health and your loved ones! You Should stop smoking!. 3)  Stop Smoking Tips: Choose a Quit date. Cut down before the Quit date. decide what you will do as a substitute when you feel the urge to smoke(gum,toothpick,exercise). 4)  It is important that you exercise regularly at least 30 minutes 5 times a week. If you develop chest pain, have severe difficulty breathing, or feel very tired , stop exercising immediately and seek medical attention. 5)  You need to lose weight. Consider a lower calorie diet and regular exercise.  6)  PLS call your ins for help with an exercise program, Silver Sneakers, also about a vasectomy re coverage. 7)  Dose inc on the clonidine , pls start ONE HALF  in the morning and continue ONE and a HALF at night 8)  OPlls keep your bedtime snack as cereal 9)  BMP prior to visit, ICD-9: 10)  Hepatic Panel prior to visit, ICD-9: 11)  Lipid Panel prior to visit, ICD-9:  fasting in January 12)  TSH prior to visit, ICD-9: 13)  HbgA1C prior to visit, ICD-9: 14)  Check your blood sugars regularly. If your readings are usually above 250 or below 70 you should contact our office. Prescriptions: LORATADINE 10 MG TABS (LORATADINE) one tab by mouth once daily discontinue singulair  #30 x 3   Entered by:   Adella Hare LPN   Authorized by:   Syliva Overman MD   Signed by:   Adella Hare LPN on 28/41/3244   Method used:   Electronically to        [facility]* (retail)       [address]       [place], [place]  [address]       Ph: [phone]       Fax: [phone]   RxID:   [id number] BENAZEPRIL HCL 40 MG TABS (BENAZEPRIL HCL) Take 1 tablet by mouth once a day  #30 x 3   Entered by:   Adella Hare LPN   Authorized by:   Syliva Overman MD   Signed by:   Adella Hare LPN on 63/11/6008   Method used:   Electronically to        [facility]* (retail)       [address]       [place], [place]  [address]       Ph: [phone]       Fax: [phone]   RxID:   [id number] RANITIDINE HCL 150 MG CAPS (RANITIDINE HCL) Take 1 capsule by mouth two times a day  #60 x 3   Entered by:   Adella Hare LPN   Authorized by:   Syliva Overman MD   Signed by:   Adella Hare LPN on 94/85/4627   Method used:   Electronically to        [facility]* (retail)       [address]       [place], [place]  [address]       Ph: [phone]       Fax: [phone]   RxID:   [id number] GLUCOTROL 10 MG TABS (GLIPIZIDE) take one tab by mouth two times a day  #60 x 3  Entered by:   Adella Hare LPN   Authorized by:   Syliva Overman MD   Signed by:   Adella Hare LPN on 16/08/9603   Method used:   Electronically to        [facility]* (retail)       [address]       [place], [place]  [address]       Ph: [phone]       Fax: [phone]   RxID:   [id number] CLONIDINE HCL 0.3 MG TABS (CLONIDINE HCL) one half tablet at 9am, and one and a half tablet at 9 pm  #60 x 3   Entered and Authorized by:   Syliva Overman MD   Signed by:   Syliva Overman MD on 09/05/2010   Method used:   Printed then faxed to ...       [facility] [address]* (retail)       [address]       [place], [place]  [address]       Ph: [phone]       Fax: [phone]   RxID:   [id number]    Orders Added: 1)  Est. Patient Level IV [[address]] 2)  T-Basic Metabolic Panel [phone] 3)  T-Hepatic Function [80076-22960] 4)  T-Lipid  Profile [80061-22930] 5)  T- Hemoglobin A1C [83036-23375] 6)  T-TSH [[id number]] 7)  Medicare Electronic Prescription [id number]

## 2010-12-03 NOTE — Letter (Signed)
Summary: misc  misc   Imported By: Curtis Sites 04/04/2010 11:10:08  _____________________________________________________________________  External Attachment:    Type:   Image     Comment:   External Document

## 2010-12-03 NOTE — Letter (Signed)
Summary: narcotic contract  narcotic contract   Imported By: Lind Guest 02/21/2010 14:01:17  _____________________________________________________________________  External Attachment:    Type:   Image     Comment:   External Document

## 2010-12-03 NOTE — Assessment & Plan Note (Signed)
Summary: office visit   Vital Signs:  Patient profile:   50 year old male Height:      70 inches Weight:      238.25 pounds BMI:     34.31 O2 Sat:      95 % Pulse rate:   91 / minute Pulse rhythm:   regular Resp:     16 per minute BP sitting:   134 / 84  (left arm) Cuff size:   large  Vitals Entered By: Everitt Amber LPN  Nutrition Counseling: Patient's BMI is greater than 25 and therefore counseled on weight management options. CC: Follow up, had right arm surgery wednesday   Primary Care Provider:  Syliva Overman MD  CC:  Follow up and had right arm surgery wednesday.  History of Present Illness: Reports  thathe has been doing fairly well. He had right elbow surgery since his last visit, the pain and swelling are accordingkly improving. Denies recent fever or chills. Denies sinus pressure, nasal congestion , ear pain or sore throat. Denies chest congestion, or cough productive of sputum. Denies chest pain, palpitations, PND, orthopnea or leg swelling. Denies abdominal pain, nausea, vomitting, diarrhea or constipation. Denies change in bowel movements or bloody stool. Denies dysuria , frequency, incontinence or hesitancy. He reprts fluctuations in his blood sugaRS, GENERALLY HE FEELS THEY ARE DOING BETTER. hE DOES TEST AT LEASDTY ONCE DAILYHDenies headaches, vertigo, seizures. Denies depression, anxiety or insomnia. Denies  rash, lesions, or itch.     Allergies: 1)  ! Codeine  Past History:  Family History: Last updated: 02/15/2008 Mother HTN Dad HTN Sister two healthy Brother 3 HTN in 1  Social History: Last updated: 12/26/2009 disabled Divorced Three children  Alcohol use-no Drug use-no Quit nicotine 11/21/2009  Risk Factors: Alcohol Use: 0 (09/10/2009) Exercise: yes (09/10/2009)  Risk Factors: Smoking Status: current (10/02/2009) Packs/Day: 0.5 (10/02/2009)  Past medical, surgical, family and social histories (including risk factors)  reviewed, and no changes noted (except as noted below).  Past Medical History: Reviewed history from 02/15/2008 and no changes required. Current Problems:  ALLERGIC RHINITIS (ICD-477.9) DEPRESSION (ICD-311) HYPERLIPIDEMIA (ICD-272.4) OBESITY (ICD-278.00) HYPERTENSION (ICD-401.9) DIABETES MELLITUS, TYPE I (ICD-250.01) BACK PAIN (ICD-724.5)  Past Surgical History: Cyst removed from left lung (1995) Appendectomy (1610) Back surgery (2008) July 2011-Right arm surgery to remove bursitis pocket and bone spur  Family History: Reviewed history from 02/15/2008 and no changes required. Mother HTN Dad HTN Sister two healthy Brother 3 HTN in 1  Social History: Reviewed history from 12/26/2009 and no changes required. disabled Divorced Three children  Alcohol use-no Drug use-no Quit nicotine 11/21/2009  Review of Systems      See HPI General:  Complains of fatigue. Eyes:  Denies discharge, eye pain, and red eye. GU:  Complains of erectile dysfunction. MS:  Complains of joint swelling, low back pain, mid back pain, and stiffness.  Physical Exam  General:  Well-developed,well-nourished,in no acute distress; alert,appropriate and cooperative throughout examination HEENT: No facial asymmetry,  EOMI, No sinus tenderness, TM's Clear, oropharynx  pink and moist.   Chest: Clear to auscultation bilaterally.  CVS: S1, S2, No murmurs, No S3.   Abd: Soft, Nontender.  MS: Adequate ROM spine, hips, shoulders and knees.  Ext: No edema.   CNS: CN 2-12 intact, power tone and sensation normal throughout.   Skin: Intact, no visible lesions or rashes.  Psych: Good eye contact, normal affect.  Memory intact, not anxious or depressed appearing.    Impression & Recommendations:  Problem # 1:  DIABETES MELLITUS, TYPE II (ICD-250.00) Assessment Comment Only  His updated medication list for this problem includes:    Aspirin 81 Mg Tbec (Aspirin) ..... One tab by mouth once daily    Exforge  10-320 Mg Tabs (Amlodipine besylate-valsartan) .Marland Kitchen... Take 1 tablet by mouth once a day    Glucotrol 10 Mg Tabs (Glipizide) .Marland Kitchen... Take one tab by mouth two times a day    Janumet 50-1000 Mg Tabs (Sitagliptin-metformin hcl) .Marland Kitchen... Take 1 tablet by mouth two times a day    Benazepril Hcl 40 Mg Tabs (Benazepril hcl) .Marland Kitchen... Take 1 tablet by mouth once a day    Levemir Flexpen 100 Unit/ml Soln (Insulin detemir) .Marland KitchenMarland KitchenMarland KitchenMarland Kitchen 65 units twice daily, new dose effective 04/22/2010  Orders: T-Urine Microalbumin w/creat. ratio [phone]) T- Hemoglobin A1C [phone])  Labs Reviewed: Creat: 0.74 (11/26/2009)     Last Eye Exam: diabetic retinopathy (06/06/2009) Reviewed HgBA1c results: 7.7 (04/18/2010)  7.7 (12/26/2009)  Problem # 2:  ELBOW PAIN, RIGHT (ICD-719.42) Assessment: Improved  Problem # 3:  HYPERTENSION (ICD-401.9) Assessment: Improved  His updated medication list for this problem includes:    Exforge 10-320 Mg Tabs (Amlodipine besylate-valsartan) .Marland Kitchen... Take 1 tablet by mouth once a day    Benazepril Hcl 40 Mg Tabs (Benazepril hcl) .Marland Kitchen... Take 1 tablet by mouth once a day    Clonidine Hcl 0.3 Mg Tabs (Clonidine hcl) .Marland Kitchen... Take 1 tab by mouth at bedtime  Orders: T-Basic Metabolic Panel ([id number])  BP today: 134/84 Prior BP: 148/82 (04/18/2010)  Labs Reviewed: K+: 4.7 (11/26/2009) Creat: : 0.74 (11/26/2009)   Chol: 92 (11/26/2009)   HDL: 37 (11/26/2009)   LDL: 37 (11/26/2009)   TG: 89 (11/26/2009)  Problem # 4:  HYPERLIPIDEMIA (ICD-272.4) Assessment: Comment Only  His updated medication list for this problem includes:    Crestor 20 Mg Tabs (Rosuvastatin calcium) .Marland Kitchen... Take one tab by mouth once daily  Orders: T-Hepatic Function [id number]) T-Lipid Profile [phone])  Labs Reviewed: SGOT: 14 (11/26/2009)   SGPT: 17 (11/26/2009)   HDL:37 (11/26/2009), 42 (10/02/2009)  LDL:37 (11/26/2009), 99 (10/02/2009)  Chol:92 (11/26/2009), 170 (10/02/2009)  Trig:89 (11/26/2009),  144 (10/02/2009)  Problem # 5:  BACK PAIN (ICD-724.5) Assessment: Unchanged  His updated medication list for this problem includes:    Aspirin 81 Mg Tbec (Aspirin) ..... One tab by mouth once daily    Flexeril 10 Mg Tabs (Cyclobenzaprine hcl) .Marland Kitchen... Take 1 tablet by mouth two times a day    Etodolac 500 Mg Tabs (Etodolac) .Marland Kitchen... Take 1 tablet by mouth two times a day    Percocet 7.5-500 Mg Tabs (Oxycodone-acetaminophen) .Marland Kitchen... Take 1 tablet by mouth two times a day  Complete Medication List: 1)  Aspirin 81 Mg Tbec (Aspirin) .... One tab by mouth once daily 2)  Exforge 10-320 Mg Tabs (Amlodipine besylate-valsartan) .... Take 1 tablet by mouth once a day 3)  Glucotrol 10 Mg Tabs (Glipizide) .... Take one tab by mouth two times a day 4)  Crestor 20 Mg Tabs (Rosuvastatin calcium) .... Take one tab by mouth once daily 5)  Singulair 10 Mg Tabs (Montelukast sodium) .... Take 1 tablet by mouth once a day 6)  Cialis 20 Mg Tabs (Tadalafil) .... Uad 7)  Flexeril 10 Mg Tabs (Cyclobenzaprine hcl) .... Take 1 tablet by mouth two times a day 8)  Ranitidine Hcl 150 Mg Caps (Ranitidine hcl) .... Take 1 capsule by mouth two times a day 9)  Prodigy Blood Glucose Test Strp (Glucose blood) .Marland KitchenMarland KitchenMarland Kitchen  Twice daily testing 10)  Freestyle Test Strp (Glucose blood) .... Three times a day 11)  Janumet 50-1000 Mg Tabs (Sitagliptin-metformin hcl) .... Take 1 tablet by mouth two times a day 12)  Etodolac 500 Mg Tabs (Etodolac) .... Take 1 tablet by mouth two times a day 13)  Pen Needles 5/16" 31g X 8 Mm Misc (Insulin pen needle) .... Once daily use 14)  Lyrica 75 Mg Caps (Pregabalin) .... Take 1 capsule by mouth three times a day 15)  Percocet 7.5-500 Mg Tabs (Oxycodone-acetaminophen) .... Take 1 tablet by mouth two times a day 16)  Benazepril Hcl 40 Mg Tabs (Benazepril hcl) .... Take 1 tablet by mouth once a day 17)  Clonidine Hcl 0.3 Mg Tabs (Clonidine hcl) .... Take 1 tab by mouth at bedtime 18)  Levemir Flexpen 100  Unit/ml Soln (Insulin detemir) .... 65 units twice daily, new dose effective 04/22/2010 19)  Septra Ds 800-160 Mg Tabs (Sulfamethoxazole-trimethoprim) .... Take 1 tablet by mouth two times a day  Other Orders: T-Vitamin D (25-Hydroxy) ([id number])  Patient Instructions: 1)  F/U Sept 16 or after. 2)  Microalb today. 3)  Fasting lipid , hePATIC, CHEM 7 , hba1c IN SEPT, also Vit d 4)  It is important that you exercise regularly at least 20 minutes 5 times a week. If you develop chest pain, have severe difficulty breathing, or feel very tired , stop exercising immediately and seek medical attention. 5)  You need to lose weight. Consider a lower calorie diet and regular exercise.  6)  Hepatic Panel prior to visit, ICD-9: 7)  Lipid Panel prior to visit, ICD-9: 8)  Check your blood sugars regularly. If your readings are usually above : or below 70 you should contact our office. Prescriptions: PERCOCET 7.5-500 MG TABS (OXYCODONE-ACETAMINOPHEN) Take 1 tablet by mouth two times a day  #60 x 0   Entered by:   Everitt Amber LPN   Authorized by:   Syliva Overman MD   Signed by:   Everitt Amber LPN on 09/81/1914   Method used:   Printed then faxed to ...       [facility]* (retail)       [address]       [place], [place]  [address]       Ph: [phone]       Fax: [phone]   RxID:   [id number] SEPTRA DS 800-160 MG TABS (SULFAMETHOXAZOLE-TRIMETHOPRIM) Take 1 tablet by mouth two times a day  #20 x 0   Entered and Authorized by:   Syliva Overman MD   Signed by:   Syliva Overman MD on 06/04/2010   Method used:   Electronically to        [facility]* (retail)       [address]       [place], [place]  [address]       Ph: [phone]       Fax: [phone]   RxID:   [id number]

## 2010-12-03 NOTE — Assessment & Plan Note (Signed)
Summary: follow up /slj   Vital Signs:  Patient profile:   50 year old male Height:      70 inches Weight:      235.25 pounds BMI:     33.88 O2 Sat:      93 % Pulse rate:   101 / minute Pulse rhythm:   regular Resp:     16 per minute BP sitting:   150 / 82  (left arm)  Vitals Entered By: Everitt Amber LPN (July 25, 2010 9:05 AM)  Nutrition Counseling: Patient's BMI is greater than 25 and therefore counseled on weight management options. CC: Follow up chronic problems   Primary Care Provider:  Syliva Overman MD  CC:  Follow up chronic problems.  History of Present Illness: Reports  that he has ben doing fairly well. Denies recent fever or chills. Denies sinus pressure, nasal congestion , ear pain or sore throat. Denies chest congestion, or cough productive of sputum. Denies chest pain, palpitations, PND, orthopnea or leg swelling. Denies abdominal pain, nausea, vomitting, diarrhea or constipation. Denies change in bowel movements or bloody stool. Denies dysuria , frequency, incontinence or hesitancy. he has chronic oint pain, swelling, and  reduced mobility. Denies headaches, vertigo, seizures. Denies depression, anxiety or insomnia. Denies  rash, lesions, or itch.     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 One Tab By Mouth Once Daily 2)  Exforge 10-320 Mg Tabs (Amlodipine Besylate-Valsartan) .... Take 1 Tablet By Mouth Once A Day 3)  Glucotrol 10 Mg Tabs (Glipizide) .... Take One Tab By Mouth Two Times A Day 4)  Crestor 20 Mg Tabs (Rosuvastatin Calcium) .... Take One Tab By Mouth Once Daily 5)  Singulair 10 Mg Tabs (Montelukast Sodium) .... Take 1 Tablet By Mouth Once A Day 6)  Cialis 20 Mg Tabs (Tadalafil) .... Uad 7)  Flexeril 10 Mg Tabs (Cyclobenzaprine Hcl) .... Take 1 Tablet By Mouth Two Times A Day 8)  Ranitidine Hcl 150 Mg Caps (Ranitidine Hcl) .... Take 1 Capsule By Mouth Two Times A Day 9)  Prodigy Blood Glucose Test  Strp (Glucose Blood) .... Twice Daily Testing 10)  Freestyle Test  Strp (Glucose Blood) .... Three Times A Day 11)  Janumet 50-1000 Mg Tabs (Sitagliptin-Metformin Hcl) .... Take 1 Tablet By Mouth Two Times A Day 12)  Etodolac 500 Mg Tabs (Etodolac) .... Take 1 Tablet By Mouth Two Times A Day 13)  Pen Needles 5/16" 31g X 8 Mm Misc (Insulin Pen Needle) .... Once Daily Use 14)  Benazepril Hcl 40 Mg Tabs (Benazepril Hcl) .... Take 1 Tablet By Mouth Once A Day 15)  Clonidine Hcl 0.3 Mg Tabs (Clonidine Hcl) .... Take 1 Tab By Mouth At Bedtime 16)  Levemir Flexpen 100 Unit/ml Soln (Insulin Detemir) .... 65 Units Twice Daily, New Dose Effective 04/22/2010 17)  Septra Ds  800-160 Mg Tabs (Sulfamethoxazole-Trimethoprim) .... Take 1 Tablet By Mouth Two Times A Day 18)  Percocet 7.5-500 Mg Tabs (Oxycodone-Acetaminophen) .... Take 1 Tablet By Mouth Three Times A Day 19)  Loratadine 10 Mg Tabs (Loratadine) .... One Tab By Mouth Once Daily Discontinue Singulair 20)  Gabapentin 300 Mg Caps (Gabapentin) .... Take 1 Capsule By Mouth Two Times A Day  Current Medications (verified): 1)  Aspirin 81 Mg  Tbec (Aspirin) .... One Tab By Mouth Once Daily 2)  Exforge 10-320 Mg Tabs (Amlodipine Besylate-Valsartan) .... Take 1 Tablet By Mouth Once A Day 3)  Glucotrol 10 Mg Tabs (Glipizide) ....  Take One Tab By Mouth Two Times A Day 4)  Crestor 20 Mg Tabs (Rosuvastatin Calcium) .... Take One Tab By Mouth Once Daily 5)  Singulair 10 Mg Tabs (Montelukast Sodium) .... Take 1 Tablet By Mouth Once A Day 6)  Cialis 20 Mg Tabs (Tadalafil) .... Uad 7)  Flexeril 10 Mg Tabs (Cyclobenzaprine Hcl) .... Take 1 Tablet By Mouth Two Times A Day 8)  Ranitidine Hcl 150 Mg Caps (Ranitidine Hcl) .... Take 1 Capsule By Mouth Two Times A Day 9)  Prodigy Blood Glucose Test  Strp (Glucose Blood) .... Twice Daily Testing 10)  Freestyle Test  Strp (Glucose Blood) .... Three Times A Day 11)  Janumet 50-1000 Mg Tabs (Sitagliptin-Metformin Hcl) .... Take 1 Tablet By Mouth Two Times A Day 12)  Etodolac 500 Mg Tabs (Etodolac) .... Take 1 Tablet By Mouth Two Times A Day 13)  Pen Needles 5/16" 31g X 8 Mm Misc (Insulin Pen Needle) .... Once Daily Use 14)  Benazepril Hcl 40 Mg Tabs (Benazepril Hcl) .... Take 1 Tablet By Mouth Once A Day 15)  Clonidine Hcl 0.3 Mg Tabs (Clonidine Hcl) .... Take 1 Tab By Mouth At Bedtime 16)  Levemir Flexpen 100 Unit/ml Soln (Insulin Detemir) .... 65 Units Twice Daily, New Dose Effective 04/22/2010 17)  Percocet 7.5-500 Mg Tabs (Oxycodone-Acetaminophen) .... Take 1 Tablet By Mouth Three Times A Day 18)  Loratadine 10 Mg Tabs (Loratadine) .... One Tab By Mouth Once Daily Discontinue Singulair 19)  Neurontin 600 Mg Tabs (Gabapentin) .... Take 1 Tablet By Mouth Three Times A Day  Allergies (verified): 1)  ! Codeine  Review of Systems      See HPI General:  Complains of fatigue. Eyes:  Denies blurring and discharge. MS:  Complains of joint pain, low back pain, mid back pain, and stiffness. Endo:  Denies excessive hunger and excessive thirst; tests regularly, has meter, reports some hypoglycemic episodes , and generally good #'s recorded. Heme:  Denies abnormal bruising and bleeding. Allergy:  Complains of seasonal allergies.  Physical Exam  General:  Well-developed,well-nourished,in  no acute distress; alert,appropriate and cooperative throughout examination HEENT: No facial asymmetry,  EOMI, No sinus tenderness, TM's Clear, oropharynx  pink and moist.   Chest: Clear to auscultation bilaterally.  CVS: S1, S2, No murmurs, No S3.   Abd: Soft, Nontender.  MS: Adequate ROM spine, hips, shoulders and knees.  Ext: No edema.   CNS: CN 2-12 intact, power tone and sensation normal throughout.   Skin: Intact, no visible lesions or rashes.  Psych: Good eye contact, normal affect.  Memory intact, not anxious or depressed appearing.   Diabetes Management Exam:    Foot Exam (with socks and/or shoes not present):       Sensory-Monofilament:          Left foot: diminished  Right foot: diminished       Inspection:          Left foot: normal          Right foot: normal       Nails:          Left foot: normal          Right foot: normal   Impression & Recommendations:  Problem # 1:  DIABETES MELLITUS, TYPE II (ICD-250.00) Assessment Improved  His updated medication list for this problem includes:    Aspirin 81 Mg Tbec (Aspirin) ..... One tab by mouth once daily    Exforge 10-320 Mg Tabs (Amlodipine besylate-valsartan) .Marland Kitchen... Take 1 tablet by mouth once a day    Glucotrol 10 Mg Tabs (Glipizide) .Marland Kitchen... Take one tab by mouth two times a day    Janumet 50-1000 Mg Tabs (Sitagliptin-metformin hcl) .Marland Kitchen... Take 1 tablet by mouth two times a day    Benazepril Hcl 40 Mg Tabs (Benazepril hcl) .Marland Kitchen... Take 1 tablet by mouth once a day    Levemir Flexpen 100 Unit/ml Soln (Insulin detemir) .Marland KitchenMarland KitchenMarland KitchenMarland Kitchen 65 units twice daily, new dose effective 04/22/2010 Patient advised to reduce carbs and sweets, commit to regular physical activity, take meds as prescribed, test blood sugars as directed, and attempt to lose weight , to improve blood sugar control.  Labs Reviewed: Creat: 0.73 (07/18/2010)     Last Eye Exam: diabetic retinopathy (06/06/2009) Reviewed HgBA1c results: 7.5 (07/18/2010)  7.7  (04/18/2010)  Problem # 2:  HYPERLIPIDEMIA (ICD-272.4) Assessment: Improved  His updated medication list for this problem includes:    Crestor 20 Mg Tabs (Rosuvastatin calcium) .Marland Kitchen... Take one tab by mouth once daily, pt to reduce to 3 times weekly   Labs Reviewed: SGOT: 25 (07/18/2010)   SGPT: 38 (07/18/2010)   HDL:33 (07/18/2010), 37 (11/26/2009)  LDL:36 (07/18/2010), 37 (11/26/2009)  Chol:81 (07/18/2010), 92 (11/26/2009)  Trig:62 (07/18/2010), 89 (11/26/2009) Low fat dietdiscussed and encouraged  Problem # 3:  HYPERTENSION (ICD-401.9) Assessment: Deteriorated  The following medications were removed from the medication list:    Clonidine Hcl 0.3 Mg Tabs (Clonidine hcl) .Marland Kitchen... Take 1 tab by mouth at bedtime His updated medication list for this problem includes:    Exforge 10-320 Mg Tabs (Amlodipine besylate-valsartan) .Marland Kitchen... Take 1 tablet by mouth once a day    Benazepril Hcl 40 Mg Tabs (Benazepril hcl) .Marland Kitchen... Take 1 tablet by mouth once a day    Clonidine Hcl 0.3 Mg Tabs (Clonidine hcl) ..... One and a half at bedtime  BP today: 150/82 Prior BP: 134/84 (06/04/2010)  Labs Reviewed: K+: 4.5 (07/18/2010) Creat: : 0.73 (07/18/2010)   Chol: 81 (07/18/2010)   HDL: 33 (07/18/2010)   LDL: 36 (07/18/2010)   TG: 62 (07/18/2010)  Problem # 4:  OBESITY (ICD-278.00) Assessment: Improved  Ht: 70 (07/25/2010)   Wt: 235.25 (07/25/2010)   BMI: 33.88 (07/25/2010) therapeutic lifestyle change discussed and encouraged  Problem # 5:  NICOTINE ADDICTION (ICD-305.1) Assessment: Deteriorated  Encouraged smoking cessation and discussed different methods for smoking cessation.   Complete Medication List: 1)  Aspirin 81 Mg Tbec (Aspirin) .... One tab by mouth once daily 2)  Exforge 10-320 Mg Tabs (Amlodipine besylate-valsartan) .... Take 1 tablet by mouth once a day 3)  Glucotrol 10 Mg Tabs (Glipizide) .... Take one tab by mouth two times a day 4)  Crestor 20 Mg Tabs (Rosuvastatin calcium) .... Take  one tab by mouth once daily 5)  Singulair 10 Mg Tabs (Montelukast  sodium) .... Take 1 tablet by mouth once a day 6)  Cialis 20 Mg Tabs (Tadalafil) .... Uad 7)  Flexeril 10 Mg Tabs (Cyclobenzaprine hcl) .... Take 1 tablet by mouth two times a day 8)  Ranitidine Hcl 150 Mg Caps (Ranitidine hcl) .... Take 1 capsule by mouth two times a day 9)  Prodigy Blood Glucose Test Strp (Glucose blood) .... Twice daily testing 10)  Freestyle Test Strp (Glucose blood) .... Three times a day 11)  Janumet 50-1000 Mg Tabs (Sitagliptin-metformin hcl) .... Take 1 tablet by mouth two times a day 12)  Etodolac 500 Mg Tabs (Etodolac) .... Take 1 tablet by mouth two times a day 13)  Pen Needles 5/16" 31g X 8 Mm Misc (Insulin pen needle) .... Once daily use 14)  Benazepril Hcl 40 Mg Tabs (Benazepril hcl) .... Take 1 tablet by mouth once a day 15)  Levemir Flexpen 100 Unit/ml Soln (Insulin detemir) .... 65 units twice daily, new dose effective 04/22/2010 16)  Percocet 7.5-500 Mg Tabs (Oxycodone-acetaminophen) .... Take 1 tablet by mouth three times a day 17)  Loratadine 10 Mg Tabs (Loratadine) .... One tab by mouth once daily discontinue singulair 18)  Neurontin 600 Mg Tabs (Gabapentin) .... Take 1 tablet by mouth three times a day 19)  Clonidine Hcl 0.3 Mg Tabs (Clonidine hcl) .... One and a half at bedtime  Other Orders: Influenza Vaccine NON MCR (44010)  Patient Instructions: 1)  F/U in 6 weeks. 2)  Dose increase of clonidine to ONE and a HALF at bedtime, your BP is high. 3)  Reduce the crestor to 3 times weekly, Mon, Wed, and FRiday. 4)  It is important that you exercise regularly at least 20 minutes 5 times a week. If you develop chest pain, have severe difficulty breathing, or feel very tired , stop exercising immediately and seek medical attention. 5)  You need to lose weight. Consider a lower calorie diet and regular exercise.  6)  Tobacco is very bad for your health and your loved ones! You Should stop  smoking!. 7)  Stop Smoking Tips: Choose a Quit date. Cut down before the Quit date. decide what you will do as a substitute when you feel the urge to smoke(gum,toothpick,exercise). 8)  You cannot smoke with the nicotine path 9)  The medication list was reviewed and reconciled..All changed/newly prescribed medications were explained. A complete medication list was provided to the patient/caregiver.  Prescriptions: PERCOCET 7.5-500 MG TABS (OXYCODONE-ACETAMINOPHEN) Take 1 tablet by mouth three times a day  #90 x 0   Entered by:   Everitt Amber LPN   Authorized by:   Syliva Overman MD   Signed by:   Everitt Amber LPN on 27/25/3664   Method used:   Historical   RxID:   [id number] JANUMET 50-1000 MG TABS (SITAGLIPTIN-METFORMIN HCL) Take 1 tablet by mouth two times a day  #180 x 1   Entered by:   Everitt Amber LPN   Authorized by:   Syliva Overman MD   Signed by:   Everitt Amber LPN on 43/32/9518   Method used:   Printed then faxed to ...       [facility]* (retail)       [address]       [place], [place]  [address]       Ph: [phone]       Fax: [phone]   RxID:   [id number] CLONIDINE HCL 0.3 MG TABS (  CLONIDINE HCL) one and a half at bedtime  #45 x 4   Entered and Authorized by:   Syliva Overman MD   Signed by:   Syliva Overman MD on 07/25/2010   Method used:   Printed then faxed to ...       [facility]* (retail)       [address]       [place], [place]  [address]       Ph: [phone]       Fax: [phone]   RxID:   [id number]     Influenza Vaccine    Vaccine Type: Fluvax Non-MCR    Site: right deltoid    Mfr: novartis     Dose: 0.5 ml    Route: IM    Given by: Everitt Amber LPN    Exp. Date: 03/2011    Lot #: [id number]

## 2010-12-05 NOTE — Miscellaneous (Signed)
  Clinical Lists Changes  Medications: Added new medication of HUMALOG 100 UNIT/ML SOLN (INSULIN LISPRO (HUMAN)) 5 units twice daily, before breakfas and supper - Signed Rx of HUMALOG 100 UNIT/ML SOLN (INSULIN LISPRO (HUMAN)) 5 units twice daily, before breakfas and supper;  #300 units x 3;  Signed;  Entered by: Syliva Overman MD;  Authorized by: Syliva Overman MD;  Method used: Historical    Prescriptions: HUMALOG 100 UNIT/ML SOLN (INSULIN LISPRO (HUMAN)) 5 units twice daily, before breakfas and supper  #300 units x 3   Entered and Authorized by:   Syliva Overman MD   Signed by:   Syliva Overman MD on 11/11/2010   Method used:   Historical   RxID:   [id number]

## 2010-12-05 NOTE — Progress Notes (Signed)
Summary: MEDICATION  Phone Note Call from Patient   Summary of Call: PATIENT CALLED  IN STATES THAT HE NEED HIS PERCOCET REFILLED WILL PICK IT UP.Marland KitchenPATIENT ALSO WANTED TO KNOW COULD YOU UP HIS DOSAGE STILL HAVING BACK PAINS Initial call taken by: Eugenio Hoes,  October 14, 2010 1:46 PM  Follow-up for Phone Call        no dose increase before next ov, script should be ready if due, if not pls let me know Follow-up by: Syliva Overman MD,  October 14, 2010 1:57 PM  Additional Follow-up for Phone Call Additional follow up Details #1::        patient was advised med is not due Additional Follow-up by: Adella Hare LPN,  October 15, 2010 12:13 PM

## 2010-12-05 NOTE — Medication Information (Signed)
Summary: Tax adviser   Imported By: Lind Guest 10/25/2010 08:38:40  _____________________________________________________________________  External Attachment:    Type:   Image     Comment:   External Document

## 2010-12-05 NOTE — Letter (Signed)
Summary: DOSE INCREASE OF MEDICATION  DOSE INCREASE OF MEDICATION   Imported By: Lind Guest 11/20/2010 08:31:59  _____________________________________________________________________  External Attachment:    Type:   Image     Comment:   External Document

## 2010-12-05 NOTE — Miscellaneous (Signed)
Summary: Med change  Clinical Lists Changes  Medications: Changed medication from HUMALOG 100 UNIT/ML SOLN (INSULIN LISPRO (HUMAN)) 5 units twice daily, before breakfas and supper to HUMALOG KWIKPEN 100 UNIT/ML SOLN (INSULIN LISPRO (HUMAN)) 5 units two times a day before breakfast and before supper - Signed Rx of HUMALOG KWIKPEN 100 UNIT/ML SOLN (INSULIN LISPRO (HUMAN)) 5 units two times a day before breakfast and before supper;  #62month x 3;  Signed;  Entered by: Everitt Amber LPN;  Authorized by: Syliva Overman MD;  Method used: Electronically to [facility] [address] [place], [place]  [address], Ph: [phone], Fax: [phone]    Prescriptions: HUMALOG KWIKPEN 100 UNIT/ML SOLN (INSULIN LISPRO (HUMAN)) 5 units two times a day before breakfast and before supper  #68month x 3   Entered by:   Everitt Amber LPN   Authorized by:   Syliva Overman MD   Signed by:   Everitt Amber LPN on 13/06/6577   Method used:   Electronically to        [facility] [address]* (retail)       [address]       [place], [place]  [address]       Ph: [phone]       Fax: [phone]   RxID:   [id number]

## 2010-12-05 NOTE — Medication Information (Signed)
Summary: Tax adviser   Imported By: Lind Guest 11/20/2010 08:31:28  _____________________________________________________________________  External Attachment:    Type:   Image     Comment:   External Document

## 2010-12-05 NOTE — Assessment & Plan Note (Signed)
Summary: follow up/slj   Vital Signs:  Patient profile:   50 year old male Height:      70 inches Weight:      243.25 pounds BMI:     35.03 O2 Sat:      96 % Pulse rate:   80 / minute Pulse rhythm:   regular Resp:     16 per minute BP sitting:   160 / 94  (left arm) Cuff size:   large  Vitals Entered By: Everitt Amber LPN (November 19, 2010 11:07 AM)  Nutrition Counseling: Patient's BMI is greater than 25 and therefore counseled on weight management options. CC: Follow up chronic problems Comments [facility]   Primary Care Provider:  Syliva Overman MD  CC:  Follow up chronic problems.  History of Present Illness: Reports  that he has been  doing well.He reports improved blood sugars Denies recent fever or chills. Denies sinus pressure, nasal congestion , ear pain or sore throat. Denies chest congestion, or cough productive of sputum. Denies chest pain, palpitations, PND, orthopnea or leg swelling. Denies abdominal pain, nausea, vomitting, diarrhea or constipation. Denies change in bowel movements or bloody stool. Denies dysuria , frequency, incontinence or hesitancy.  Denies headaches, vertigo, seizures. Denies depression, anxiety or insomnia. Denies  rash, lesions, or itch.     Current Medications (verified): 1)  Aspirin 81 Mg  Tbec (Aspirin) .... One Tab By Mouth Once Daily 2)  Exforge 10-320 Mg Tabs (Amlodipine Besylate-Valsartan) .... Take 1 Tablet By Mouth Once A Day 3)  Glucotrol 10 Mg Tabs (Glipizide) .... Take One Tab By Mouth Two Times A Day 4)  Crestor 20 Mg Tabs (Rosuvastatin Calcium) .... Take One Tab By Mouth Once Monday, Wednesday and Friday 5)  Cialis 20 Mg Tabs (Tadalafil) .... Use As Directed 6)  Flexeril 10 Mg Tabs (Cyclobenzaprine Hcl) .... Take 1 Tablet By Mouth Two Times A Day As Needed 7)  Ranitidine Hcl 150 Mg Caps (Ranitidine Hcl) .... Take 1 Capsule By Mouth Two Times A Day 8)  Prodigy Blood Glucose Test  Strp  (Glucose Blood) .... Twice Daily Testing 9)  Freestyle Test  Strp (Glucose Blood) .... Three Times A Day 10)  Janumet 50-1000 Mg Tabs (Sitagliptin-Metformin Hcl) .... Take 1 Tablet By Mouth Two Times A Day 11)  Etodolac 500 Mg Tabs (Etodolac) .... Take 1 Tablet By Mouth Two Times A Day 12)  Pen Needles 5/16" 31g X 8 Mm Misc (Insulin Pen Needle) .... For Use Four Times Daily 13)  Benazepril Hcl 40 Mg Tabs (Benazepril Hcl) .... Take 1 Tablet By Mouth Once A Day 14)  Levemir Flexpen 100 Unit/ml Soln (Insulin Detemir) .... 65 Units Twice Daily, New Dose Effective 04/22/2010 15)  Percocet 7.5-500 Mg Tabs (Oxycodone-Acetaminophen) .... Take 1 Tablet By Mouth Three Times A Day 16)  Loratadine 10 Mg Tabs (Loratadine) .... One Tab By Mouth Once Daily Discontinue Singulair 17)  Neurontin 300 Mg Caps (Gabapentin) .... Take 1 Tablet By Mouth Two Times A Day 18)  Clonidine Hcl 0.3 Mg Tabs (Clonidine Hcl) .... One Half Tablet At 9am, and One and A Half Tablet At 9 Pm 19)  Humalog Kwikpen 100 Unit/ml Soln (Insulin Lispro (Human)) .... 5 Units Two Times A Day Before Breakfast and Before Supper  Allergies (verified): 1)  ! Codeine  Social History: disabled Divorced Four children, youngest is 19 months in oct , 2011  Alcohol use-no Drug use-no Quit nicotine 01//2012  Review  of Systems      See HPI General:  Complains of fatigue. Eyes:  Denies discharge and red eye. MS:  Complains of joint pain, low back pain, mid back pain, muscle weakness, and stiffness. Endo:  Denies excessive thirst and excessive urination; improved blood sugars since on the mix will check the meter in the office, tests at least twice dAILY. Heme:  Denies abnormal bruising, bleeding, enlarge lymph nodes, and fevers. Allergy:  Denies hives or rash, itching eyes, persistent infections, and seasonal allergies.  Physical Exam  General:  Well-developed,well-nourished,in no acute distress; alert,appropriate and cooperative throughout  examination HEENT: No facial asymmetry,  EOMI, No sinus tenderness, TM's Clear, oropharynx  pink and moist.   Chest: Clear to auscultation bilaterally.  CVS: S1, S2, No murmurs, No S3.   Abd: Soft, Nontender.  MS: decreased  ROM spine,adequate in  hips, shoulders and knees.  Ext: No edema.   CNS: CN 2-12 intact, power tone and sensation normal throughout.   Skin: Intact, no visible lesions or rashes.  Psych: Good eye contact, normal affect.  Memory intact, not anxious or depressed appearing.   Diabetes Management Exam:    Foot Exam (with socks and/or shoes not present):       Sensory-Monofilament:          Left foot: diminished          Right foot: diminished       Inspection:          Left foot: normal          Right foot: normal       Nails:          Left foot: normal          Right foot: normal   Impression & Recommendations:  Problem # 1:  NICOTINE ADDICTION (ICD-305.1) Assessment Unchanged  Encouraged smoking cessation and discussed different methods for smoking cessation.   Problem # 2:  ERECTILE DYSFUNCTION, ORGANIC (ICD-607.84) Assessment: Unchanged  His updated medication list for this problem includes:    Cialis 20 Mg Tabs (Tadalafil) ..... Use as directed  Problem # 3:  DIABETES MELLITUS, TYPE II (ICD-250.00) Assessment: Comment Only  The following medications were removed from the medication list:    Humalog Mix 50/50 Kwikpen 50-50 % Susp (Insulin lispro prot & lispro) .Marland KitchenMarland KitchenMarland KitchenMarland Kitchen 10 units twice daily, dose increase effective 11/19/2010 His updated medication list for this problem includes:    Aspirin 81 Mg Tbec (Aspirin) ..... One tab by mouth once daily    Exforge 10-320 Mg Tabs (Amlodipine besylate-valsartan) .Marland Kitchen... Take 1 tablet by mouth once a day    Glucotrol 10 Mg Tabs (Glipizide) .Marland Kitchen... Take one tab by mouth two times a day    Janumet 50-1000 Mg Tabs (Sitagliptin-metformin hcl) .Marland Kitchen... Take 1 tablet by mouth two times a day    Benazepril Hcl 40 Mg Tabs  (Benazepril hcl) .Marland Kitchen... Take 1 tablet by mouth once a day    Levemir Flexpen 100 Unit/ml Soln (Insulin detemir) .Marland KitchenMarland KitchenMarland KitchenMarland Kitchen 65 units twice daily, new dose effective 04/22/2010    Humalog Kwikpen 100 Unit/ml Soln (Insulin lispro (human)) .Marland KitchenMarland KitchenMarland KitchenMarland Kitchen 5 units two times a day before breakfast and before supper Patient advised to reduce carbs and sweets, commit to regular physical activity, take meds as prescribed, test blood sugars as directed, and attempt to lose weight , to improve blood sugar control.   Labs Reviewed: Creat: 0.67 (11/08/2010)     Last Eye Exam: diabetic retinopathy (06/06/2009) Reviewed HgBA1c results: 8.3 (  11/08/2010)  7.5 (07/18/2010)  Problem # 4:  HYPERTENSION (ICD-401.9) Assessment: Deteriorated  The following medications were removed from the medication list:    Clonidine Hcl 0.3 Mg Tabs (Clonidine hcl) ..... One half tablet at 9am, and one and a half tablet at 9 pm His updated medication list for this problem includes:    Exforge 10-320 Mg Tabs (Amlodipine besylate-valsartan) .Marland Kitchen... Take 1 tablet by mouth once a day    Benazepril Hcl 40 Mg Tabs (Benazepril hcl) .Marland Kitchen... Take 1 tablet by mouth once a day    Clonidine Hcl 0.3 Mg Tabs (Clonidine hcl) ..... One and a half in the morning and one and a half in the evening, dose inc effective 11/19/2010    Clonidine Hcl 0.3 Mg Tabs (Clonidine hcl) ..... One and a half tablets twce daily, dose increase effective 11/19/2010  Orders: Medicare Electronic Prescription [phone])  BP today: 160/94 Prior BP: 150/92 (09/05/2010)  Labs Reviewed: K+: 4.3 (11/08/2010) Creat: : 0.67 (11/08/2010)   Chol: 101 (11/08/2010)   HDL: 35 (11/08/2010)   LDL: 46 (11/08/2010)   TG: 101 (11/08/2010)  Complete Medication List: 1)  Aspirin 81 Mg Tbec (Aspirin) .... One tab by mouth once daily 2)  Exforge 10-320 Mg Tabs (Amlodipine besylate-valsartan) .... Take 1 tablet by mouth once a day 3)  Glucotrol 10 Mg Tabs (Glipizide) .... Take one tab by mouth two  times a day 4)  Crestor 20 Mg Tabs (Rosuvastatin calcium) .... Take one tab by mouth once monday, wednesday and friday 5)  Cialis 20 Mg Tabs (Tadalafil) .... Use as directed 6)  Flexeril 10 Mg Tabs (Cyclobenzaprine hcl) .... Take 1 tablet by mouth two times a day as needed 7)  Ranitidine Hcl 150 Mg Caps (Ranitidine hcl) .... Take 1 capsule by mouth two times a day 8)  Prodigy Blood Glucose Test Strp (Glucose blood) .... Twice daily testing 9)  Freestyle Test Strp (Glucose blood) .... Three times a day 10)  Janumet 50-1000 Mg Tabs (Sitagliptin-metformin hcl) .... Take 1 tablet by mouth two times a day 11)  Etodolac 500 Mg Tabs (Etodolac) .... Take 1 tablet by mouth two times a day 12)  Pen Needles 5/16" 31g X 8 Mm Misc (Insulin pen needle) .... For use four times daily 13)  Benazepril Hcl 40 Mg Tabs (Benazepril hcl) .... Take 1 tablet by mouth once a day 14)  Levemir Flexpen 100 Unit/ml Soln (Insulin detemir) .... 65 units twice daily, new dose effective 04/22/2010 15)  Percocet 7.5-500 Mg Tabs (Oxycodone-acetaminophen) .... Take 1 tablet by mouth three times a day 16)  Loratadine 10 Mg Tabs (Loratadine) .... One tab by mouth once daily discontinue singulair 17)  Neurontin 300 Mg Caps (Gabapentin) .... Take 1 tablet by mouth two times a day 18)  Humalog Kwikpen 100 Unit/ml Soln (Insulin lispro (human)) .... 5 units two times a day before breakfast and before supper 19)  Clonidine Hcl 0.3 Mg Tabs (Clonidine hcl) .... One and a half in the morning and one and a half in the evening, dose inc effective 11/19/2010 20)  Clonidine Hcl 0.3 Mg Tabs (Clonidine hcl) .... One and a half tablets twce daily, dose increase effective 11/19/2010  Patient Instructions: 1)  Please schedule a follow-up appointment in 2 months. 2)  It is important that you exercise regularly at least 20 minutes 5 times a week. If you develop chest pain, have severe difficulty breathing, or feel very tired , stop exercising immediately  and seek medical attention.  3)  You need to lose weight. Consider a lower calorie diet and regular exercise.  4)  Dose inc of the clonidine. 5)  Congrats on smoking cessation. 6)  Pls carefully follow a diabetic diet Prescriptions: PEN NEEDLES 5/16" 31G X 8 MM MISC (INSULIN PEN NEEDLE) for use four times daily  #62mnth supp x 1   Entered by:   Adella Hare LPN   Authorized by:   Syliva Overman MD   Signed by:   Adella Hare LPN on 27/25/3664   Method used:   Faxed to ...       [facility] ([facility]order)       [address]       [place], [place]  [address]       Ph: [phone]       Fax: [phone]   RxID:   [id number] LORATADINE 10 MG TABS (LORATADINE) one tab by mouth once daily discontinue singulair  #90 x 0   Entered by:   Adella Hare LPN   Authorized by:   Syliva Overman MD   Signed by:   Adella Hare LPN on 32/20/2542   Method used:   Faxed to ...       [facility] ([facility])       [address]       [place], [place]  [id number]       Ph: [phone]       Fax: [phone]   RxID:   [id number] BENAZEPRIL HCL 40 MG TABS (BENAZEPRIL HCL) Take 1 tablet by mouth once a day  #90 x 0   Entered by:   Adella Hare LPN   Authorized by:   Syliva Overman MD   Signed by:   Adella Hare LPN on 99/37/1696   Method used:   Faxed to ...       [facility] ([facility])       [address]       [place], [place]  [address]       Ph: [phone]       Fax: [phone]   RxID:   [id number] ETODOLAC 500 MG TABS (ETODOLAC) Take 1 tablet by mouth two times a day  #180 x 0   Entered by:   Adella Hare LPN   Authorized by:   Syliva Overman MD   Signed by:   Adella Hare LPN on 32/67/1245   Method used:   Faxed to ...       [facility] ([facility])       [address]       [place], [place]  [address]       Ph: [phone]       Fax: [phone]   RxID:   [id number] JANUMET 50-1000 MG TABS  (SITAGLIPTIN-METFORMIN HCL) Take 1 tablet by mouth two times a day  #180 x 0   Entered by:   Adella Hare LPN   Authorized by:   Syliva Overman MD   Signed by:   Adella Hare LPN on 62/22/9798   Method used:   Faxed to ...       [facility] ([facility])       [address]       [place], [place]  [address]       Ph: [phone]       Fax: [phone]   RxID:   [id number] RANITIDINE HCL 150 MG CAPS (RANITIDINE HCL) Take 1 capsule by mouth two times a day  #  180 x 0   Entered by:   Adella Hare LPN   Authorized by:   Syliva Overman MD   Signed by:   Adella Hare LPN on 04/54/0981   Method used:   Faxed to ...       [facility])       [address]       [place], [place]  [id number]       Ph: [phone]       Fax: [phone]   RxID:   [id number] CRESTOR 20 MG TABS (ROSUVASTATIN CALCIUM) take one tab by mouth once Monday, Wednesday and Friday  #36 x 0   Entered by:   Adella Hare LPN   Authorized by:   Syliva Overman MD   Signed by:   Adella Hare LPN on 40/34/7425   Method used:   Faxed to ...       [facility] (mail[facility])       [address]       [place], [place]  [address]       Ph: [phone]       Fax: [phone]   RxID:   [id number] EXFORGE 10-320 MG TABS (AMLODIPINE BESYLATE-VALSARTAN) Take 1 tablet by mouth once a day  #90 x 0   Entered by:   Adella Hare LPN   Authorized by:   Syliva Overman MD   Signed by:   Adella Hare LPN on 70/62/3762   Method used:   Faxed to ...       [facility] ([facility])       [address]       [place], [place]  [id number]       Ph: [phone]       Fax: [phone]   RxID:   [id number] PERCOCET 7.5-500 MG TABS (OXYCODONE-ACETAMINOPHEN) Take 1 tablet by mouth three times a day  #90 x 0   Entered by:   Adella Hare LPN   Authorized by:   Syliva Overman MD   Signed by:   Adella Hare LPN on 78/93/8101   Method used:   Handwritten    RxID:   [id number] HUMALOG MIX 50/50 KWIKPEN 50-50 % SUSP (INSULIN LISPRO PROT & LISPRO) 10 units twice daily, dose increase effective 11/19/2010  #600 units x 3   Entered and Authorized by:   Syliva Overman MD   Signed by:   Syliva Overman MD on 11/19/2010   Method used:   Printed then faxed to ...       [facility] [address] (retail)       [address]       [place], [place]  [address]       Ph: [phone]       Fax: [phone]   RxID:   [id number] CLONIDINE HCL 0.3 MG TABS (CLONIDINE HCL) one and a half tablets twce daily, dose increase effective 11/19/2010  #90 x 1   Entered and Authorized by:   Syliva Overman MD   Signed by:   Syliva Overman MD on 11/19/2010   Method used:   Printed then faxed to ...       [facility] [address] (retail)       [address]       [place], [place]  [address]       Ph: [phone]       Fax: [phone]   RxID:   [id number] CLONIDINE HCL 0.3 MG TABS (CLONIDINE HCL) one and  a half in the morning and one and a half in the evening, dose inc effective 11/19/2010  #270 x 2   Entered and Authorized by:   Syliva Overman MD   Signed by:   Syliva Overman MD on 11/19/2010   Method used:   Printed then faxed to ...       [facility] (mail-order)       [address]       [place], [place]  [id number]       Ph: [phone]       Fax: [phone]   RxID:   [id number]    Orders Added: 1)  Est. Patient Level IV [244[address]] 2)  Medicare Electronic Prescription [phone]

## 2010-12-05 NOTE — Progress Notes (Signed)
Summary: medication  Phone Note Call from Patient   Summary of Call: doc raised bp pills up. but make him feel bad. wants to take only one. [phone] Initial call taken by: Rudene Anda,  November 25, 2010 1:15 PM  Follow-up for Phone Call        pls encourage him to give his body a chance to get used to the med, his blood pressure was too high whenl last seen, recommend spacing out the extra half tablet of clonidiner one and a haldf at 6am, half at 2pm and one at 10pm Follow-up by: Syliva Overman MD,  November 25, 2010 1:31 PM  Additional Follow-up for Phone Call Additional follow up Details #1::        Patient states that when he takes the one and a half in the morning it makes him so sleepy/groggy and "out of it". Thinks maybe his pressure is dropping too low. I advised him to give his body a chance to get used to the med because it was high in the office Additional Follow-up by: Everitt Amber LPN,  November 25, 2010 1:51 PM    Prescriptions: HUMALOG KWIKPEN 100 UNIT/ML SOLN (INSULIN LISPRO (HUMAN)) 5 units two times a day before breakfast and before supper  #31month x 3   Entered by:   Everitt Amber LPN   Authorized by:   Syliva Overman MD   Signed by:   Everitt Amber LPN on 09/81/1914   Method used:   Printed then faxed to ...       [facility] [address] (retail)       [address]       [place], [place]  [address]       Ph: [phone]       Fax: [phone]   RxID:   [id number] LORATADINE 10 MG TABS (LORATADINE) one tab by mouth once daily discontinue singulair  #90 x 0   Entered by:   Everitt Amber LPN   Authorized by:   Syliva Overman MD   Signed by:   Everitt Amber LPN on 40/34/7425   Method used:   Printed then faxed to ...       [facility] [address] (retail)       [address]       [place], [place]  [address]       Ph: [phone]       Fax: [phone]   RxID:   [id number] BENAZEPRIL HCL 40 MG TABS (BENAZEPRIL HCL) Take 1 tablet by mouth once a day  #90 x 0  Entered by:   Everitt Amber LPN   Authorized by:   Syliva Overman MD   Signed by:   Everitt Amber LPN on 25/42/7062   Method used:   Printed then faxed to ...       [facility] [address] (retail)       [address]       [place], [place]  [address]       Ph: [phone]       Fax: [phone]   RxID:   [id number] PEN NEEDLES 5/16" 31G X 8 MM MISC (INSULIN PEN NEEDLE) for use four times daily  #61mnth supp x 1   Entered by:   Everitt Amber LPN   Authorized by:   Syliva Overman MD   Signed by:   Everitt Amber LPN on 99/37/1696   Method used:   Printed then faxed to .Marland KitchenMarland Kitchen  [facility] [address] (retail)       [address]       [place], [place]  [address]       Ph: [phone]       Fax: [phone]   RxID:   [id number] ETODOLAC 500 MG TABS (ETODOLAC) Take 1 tablet by mouth two times a day  #180 x 0   Entered by:   Everitt Amber LPN   Authorized by:   Syliva Overman MD   Signed by:   Everitt Amber LPN on 40/08/2724   Method used:   Printed then faxed to ...       [facility] [address] (retail)       [address]       [place], [place]  [address]       Ph: [phone]       Fax: [phone]   RxID:   [id number] JANUMET 50-1000 MG TABS (SITAGLIPTIN-METFORMIN HCL) Take 1 tablet by mouth two times a day  #180 x 0   Entered by:   Everitt Amber LPN   Authorized by:   Syliva Overman MD   Signed by:   Everitt Amber LPN on 09/32/3557   Method used:   Printed then faxed to ...       [facility] [address] (retail)       [address]       [place], [place]  [address]       Ph: [phone]       Fax: [phone]   RxID:   [id number] RANITIDINE HCL 150 MG CAPS (RANITIDINE HCL) Take 1 capsule by mouth two times a day  #180 x 0   Entered by:   Everitt Amber LPN   Authorized by:   Syliva Overman MD   Signed by:   Everitt Amber LPN on 70/35/0093   Method used:   Printed then faxed to ...       [facility] [address] (retail)       [address]       [place], [place]  [address]       Ph:  [phone]       Fax: [phone]   RxID:   [id number] CRESTOR 20 MG TABS (ROSUVASTATIN CALCIUM) take one tab by mouth once Monday, Wednesday and Friday  #36 x 0   Entered by:   Everitt Amber LPN   Authorized by:   Syliva Overman MD   Signed by:   Everitt Amber LPN on 43/15/4008   Method used:   Printed then faxed to ...       [facility] [address] (retail)       [address]       [place], [place]  [address]       Ph: [phone]       Fax: [phone]   RxID:   [id number] EXFORGE 10-320 MG TABS (AMLODIPINE BESYLATE-VALSARTAN) Take 1 tablet by mouth once a day  #90 x 0   Entered by:   Everitt Amber LPN   Authorized by:   Syliva Overman MD   Signed by:   Everitt Amber LPN on 24/07/7352   Method used:   Printed then faxed to ...       [facility] [address] (retail)       [address]       [place], [place]  [address]       Ph: [phone]  Fax: [phone]   RxID:   [id number]

## 2010-12-23 ENCOUNTER — Telehealth: Payer: Self-pay | Admitting: Family Medicine

## 2010-12-24 ENCOUNTER — Encounter: Payer: Self-pay | Admitting: Family Medicine

## 2010-12-31 NOTE — Medication Information (Signed)
Summary: Tax adviser   Imported By: Lind Guest 12/24/2010 09:38:14  _____________________________________________________________________  External Attachment:    Type:   Image     Comment:   External Document

## 2010-12-31 NOTE — Progress Notes (Signed)
Summary: pick up Percoset script - fax Flexaril script  Phone Note Call from Patient   Summary of Call: Patient called - wants to pick up script for Percoset this afternoon.  Wants script for [facility] faxed to [facility] in [employer].  His contac # [id number]. Initial call taken by: Doneen Poisson  Follow-up for Phone Call        noted and narc script available for pick up Follow-up by: Adella Hare LPN,  December 23, 2010 1:15 PM  Additional Follow-up for Phone Call Additional follow up Details #1::        Dr Lodema Hong, patient is requesting refill on Flexeril, I do not see that we have refilled this since 2010, may we refill? Additional Follow-up by: Adella Hare LPN,  December 23, 2010 1:15 PM    Additional Follow-up for Phone Call Additional follow up Details #2::    please notify the patient and the pharmacy of the medication change. The new script is entered historically, please send after speaking with the patient.I changed to baclofen which is tier 1   Follow-up by: Syliva Overman MD,  December 23, 2010 5:03 PM  Additional Follow-up for Phone Call Additional follow up Details #3:: Details for Additional Follow-up Action Taken: called patient, no answer Additional Follow-up by: Adella Hare LPN,  December 23, 2010 5:09 PM  New/Updated Medications: BACLOFEN 10 MG TABS (BACLOFEN) Take 1 tablet by mouth two times a day as needed for back spasm Prescriptions: BACLOFEN 10 MG TABS (BACLOFEN) Take 1 tablet by mouth two times a day as needed for back spasm  #60 x 4   Entered by:   Adella Hare LPN   Authorized by:   Syliva Overman MD   Signed by:   Adella Hare LPN on 11/91/4782   Method used:   Electronically to        [facility] [address] (retail)       [address]       [place], [place]  [address]       Ph: [phone]       Fax: [phone]   RxID:   [id number] BACLOFEN 10 MG TABS (BACLOFEN) Take 1 tablet by mouth two times a day as needed for back spasm  #60 x 4  Entered and Authorized by:   Syliva Overman MD   Signed by:   Syliva Overman MD on 12/23/2010   Method used:   Historical   RxID:   [id number] PERCOCET 7.5-500 MG TABS (OXYCODONE-ACETAMINOPHEN) Take 1 tablet by mouth three times a day  #90 x 0   Entered by:   Everitt Amber LPN   Authorized by:   Syliva Overman MD   Signed by:   Syliva Overman MD on 12/23/2010   Method used:   Handwritten   RxID:   [id number]

## 2011-01-01 ENCOUNTER — Telehealth: Payer: Self-pay | Admitting: Family Medicine

## 2011-01-09 NOTE — Progress Notes (Signed)
Summary: refill  Phone Note Call from Patient   Summary of Call: needs a refill on benepril. [phone] [facility] Initial call taken by: Rudene Anda,  January 01, 2011 11:54 AM    Prescriptions: BENAZEPRIL HCL 40 MG TABS (BENAZEPRIL HCL) Take 1 tablet by mouth once a day  #90 x 0   Entered by:   Adella Hare LPN   Authorized by:   Syliva Overman MD   Signed by:   Adella Hare LPN on 56/21/3086   Method used:   Electronically to        [facility] [address] (retail)       [address]       [place], [place]  [address]       Ph: [phone]       Fax: [phone]   RxID:   [id number]

## 2011-01-27 ENCOUNTER — Encounter: Payer: Self-pay | Admitting: Family Medicine

## 2011-01-28 ENCOUNTER — Encounter: Payer: Self-pay | Admitting: Family Medicine

## 2011-01-29 ENCOUNTER — Encounter: Payer: Self-pay | Admitting: Family Medicine

## 2011-01-29 ENCOUNTER — Ambulatory Visit (INDEPENDENT_AMBULATORY_CARE_PROVIDER_SITE_OTHER): Payer: Medicare PPO | Admitting: Family Medicine

## 2011-01-29 ENCOUNTER — Ambulatory Visit (HOSPITAL_COMMUNITY)
Admission: RE | Admit: 2011-01-29 | Discharge: 2011-01-29 | Disposition: A | Discharge status: Home / Self Care | Payer: Medicare PPO | Source: Ambulatory Visit | Attending: Family Medicine | Admitting: Family Medicine

## 2011-01-29 DIAGNOSIS — I10 Essential (primary) hypertension: Secondary | ICD-10-CM

## 2011-01-29 DIAGNOSIS — M549 Dorsalgia, unspecified: Secondary | ICD-10-CM

## 2011-01-29 DIAGNOSIS — E7849 Other hyperlipidemia: Secondary | ICD-10-CM

## 2011-01-29 DIAGNOSIS — R5381 Other malaise: Secondary | ICD-10-CM

## 2011-01-29 DIAGNOSIS — IMO0002 Reserved for concepts with insufficient information to code with codable children: Secondary | ICD-10-CM

## 2011-01-29 DIAGNOSIS — E785 Hyperlipidemia, unspecified: Secondary | ICD-10-CM

## 2011-01-29 DIAGNOSIS — E1165 Type 2 diabetes mellitus with hyperglycemia: Secondary | ICD-10-CM

## 2011-01-29 DIAGNOSIS — Z125 Encounter for screening for malignant neoplasm of prostate: Secondary | ICD-10-CM

## 2011-01-29 LAB — CBC WITH DIFFERENTIAL/PLATELET
Basophils Absolute: 0.1 10*3/uL (ref 0.0–0.1)
Basophils Relative: 0 % (ref 0–1)
Eosinophils Relative: 1 % (ref 0–5)
HCT: 47.8 % (ref 39.0–52.0)
MCHC: 32.4 g/dL (ref 30.0–36.0)
MCV: 88.4 fL (ref 78.0–100.0)
Monocytes Absolute: 0.9 10*3/uL (ref 0.1–1.0)
Neutro Abs: 9.5 10*3/uL — ABNORMAL HIGH (ref 1.7–7.7)
RDW: 14.5 % (ref 11.5–15.5)

## 2011-01-29 LAB — BASIC METABOLIC PANEL
CO2: 26 mEq/L (ref 19–32)
Calcium: 9.7 mg/dL (ref 8.4–10.5)
Glucose, Bld: 84 mg/dL (ref 70–99)
Sodium: 139 mEq/L (ref 135–145)

## 2011-01-29 MED ORDER — GABAPENTIN 300 MG PO CAPS: 300.0000 mg | ORAL_CAPSULE | Freq: Two times a day (BID) | ORAL | Status: DC

## 2011-01-29 MED ORDER — ETODOLAC 500 MG PO TABS: 500.0000 mg | ORAL_TABLET | Freq: Two times a day (BID) | ORAL | Status: DC

## 2011-01-29 MED ORDER — "PEN NEEDLES 5/16"" 31G X 8 MM MISC": Status: DC

## 2011-01-29 MED ORDER — INSULIN DETEMIR 100 UNIT/ML ~~LOC~~ SOLN: 65.0000 [IU] | Freq: Two times a day (BID) | SUBCUTANEOUS | Status: DC

## 2011-01-29 MED ORDER — GLIPIZIDE 10 MG PO TABS: 10.0000 mg | ORAL_TABLET | Freq: Two times a day (BID) | ORAL | Status: DC

## 2011-01-29 MED ORDER — ROSUVASTATIN CALCIUM 20 MG PO TABS: ORAL_TABLET | ORAL | Status: DC

## 2011-01-29 MED ORDER — BENAZEPRIL HCL 40 MG PO TABS: 40.0000 mg | ORAL_TABLET | Freq: Every day | ORAL | Status: DC

## 2011-01-29 MED ORDER — SITAGLIPTIN PHOS-METFORMIN HCL 50-1000 MG PO TABS: 1.0000 | ORAL_TABLET | Freq: Two times a day (BID) | ORAL | Status: DC

## 2011-01-29 MED ORDER — AMLODIPINE BESYLATE-VALSARTAN 10-320 MG PO TABS: 1.0000 | ORAL_TABLET | Freq: Every day | ORAL | Status: DC

## 2011-01-29 NOTE — Progress Notes (Signed)
Subjective:    Patient ID: Derrick Hinton, male    DOB: 04-23-1961, 50 y.o.   MRN: [id number] The PT is here for follow up and re-evaluation of chronic medical conditions, medication management and review of recent lab and radiology data.  Preventive health is updated, specifically  Cancer screening, Osteoporosis screening and Immunization.   Questions or concerns regarding consultations or procedures which the PT has had in the interim are  addressed. The PT denies any adverse reactions to current medications since the last visit.  There are no new concerns.  He reports increased and uncontrolled back pain and wants referral to a pain clinic.  Diabetes He presents for his follow-up diabetic visit. He has type 2 diabetes mellitus. No MedicAlert identification noted. His disease course has been worsening. Hypoglycemia symptoms include sweats. Pertinent negatives for hypoglycemia include no headaches or seizures. Associated symptoms include fatigue and foot paresthesias. Pertinent negatives for diabetes include no blurred vision, no chest pain and no visual change. Hypoglycemia complications include nocturnal hypoglycemia. (Has awoken on avg twice per month with blood sugar down to 46) His home blood glucose trend is increasing steadily. His breakfast blood glucose range is generally 90-110 mg/dl. His dinner blood glucose range is generally 140-180 mg/dl. He does not see a podiatrist.Eye exam is not current.  Hyperlipidemia Associated symptoms include shortness of breath. Pertinent negatives include no chest pain.  Hypertension This is a chronic problem. The current episode started more than 1 year ago. The problem is unchanged. The problem is uncontrolled. Associated symptoms include shortness of breath and sweats. Pertinent negatives include no blurred vision, chest pain, headaches, orthopnea, palpitations, peripheral edema or PND. There are no associated agents to hypertension. Risk factors for  coronary artery disease include diabetes mellitus, dyslipidemia, male gender, obesity and smoking/tobacco exposure. Past treatments include calcium channel blockers, ACE inhibitors and central alpha agonists. The current treatment provides moderate improvement. There are no compliance problems.   Back Pain This is a chronic problem. The problem occurs daily. The problem has been gradually worsening since onset. The pain is present in the lumbar spine. The quality of the pain is described as burning, shooting and aching. The pain is at a severity of 7/10. The pain is severe. The pain is the same all the time. Pertinent negatives include no abdominal pain, chest pain, dysuria or headaches. He has tried NSAIDs and muscle relaxant for the symptoms.      Review of Systems  Constitutional: Positive for fatigue. Negative for chills, activity change, appetite change and unexpected weight change.  HENT: Negative for ear pain, congestion, rhinorrhea, sneezing, postnasal drip and sinus pressure.   Eyes: Negative for blurred vision, pain, redness and itching.  Respiratory: Positive for shortness of breath. Negative for cough, choking and chest tightness.   Cardiovascular: Negative for chest pain, palpitations, orthopnea and PND.  Gastrointestinal: Negative for abdominal pain and abdominal distention.  Genitourinary: Negative for dysuria and frequency.  Musculoskeletal: Positive for back pain.  Neurological: Negative for seizures and headaches.  Hematological: Does not bruise/bleed easily.  Psychiatric/Behavioral: Negative for behavioral problems and agitation.       Objective:   Physical Exam      Patient alert and oriented and in no Cardiopulmonary distress.  HEENT: No facial asymmetry, EOMI, no sinus tenderness, TM's clear, Oropharynx pink and moist.  Neck supple no adenopathy.  Chest: Clear to auscultation bilaterally.Vital signs and nurses note reviewed  CVS: S1, S2 no murmurs, no  S3.  ABD: Soft  non tender. Bowel sounds normal.  Ext: No edema  ZO:[id number]  ROM spine, shoulders,adequate in  hips and knees.  Skin: Intact, no ulcerations or rash noted.  Psych: Good eye contact, normal affect. Memory intact not anxious or depressed appearing.  CNS: CN 2-12 intact, power, tone and sensation normal throughout. Diabetic Foot Check:  Appearance - no lesions, ulcers or calluses Skin - no unusual pallor or redness Sensation - grossly intact to light touch Monofilament testing -  Right - Great toe, medial, central, lateral ball and posterior foot decreased sensation Left - Great toe, medial, central, lateral ball and posterior foot decreased sensation Pulses Left - Dorsalis Pedis and Posterior Tibia normal Right - Dorsalis Pedis and Posterior Tibia normal   Assessment & Plan:  1. Hypertension:Uncotrolled, medication compliance addressed. Changes in medication made as needed and the importance of commitment to lifestyle changes to improve blood pressure discussed and encouraged. 2.IDDM; uncontrolled, medication adjustment made 3.Back pain; deteriorated, referral to pain clinic 4.hyperlipidemia: Hyperlipidemia:Low fat diet discussed and encouraged. Continue current meds at this time  5.Nicotine, unchanged, cessation counselling done, not ready to quit.cXR to evaluate COPD

## 2011-01-29 NOTE — Patient Instructions (Addendum)
F/u in 3  months.  Dose increase on clonidine 0.3mg  to 2 at night, continue one and a half in the morning.  You are being referred for a sleep study, to the pain clinic, and you need to make appt for an eye exam.  Labs today, and fasting labs in 3 months just before next OV  You need to stop smoking

## 2011-02-02 ENCOUNTER — Encounter: Payer: Self-pay | Admitting: Family Medicine

## 2011-02-03 ENCOUNTER — Other Ambulatory Visit: Payer: Self-pay | Admitting: Family Medicine

## 2011-02-04 ENCOUNTER — Other Ambulatory Visit: Payer: Self-pay

## 2011-02-04 MED ORDER — INSULIN DETEMIR 100 UNIT/ML ~~LOC~~ SOLN: 65.0000 [IU] | Freq: Two times a day (BID) | SUBCUTANEOUS | Status: DC

## 2011-02-06 LAB — HEMOGLOBIN AND HEMATOCRIT, BLOOD
HCT: 48.8 % (ref 39.0–52.0)
Hemoglobin: 15.9 g/dL (ref 13.0–17.0)

## 2011-02-06 LAB — BASIC METABOLIC PANEL
BUN: 12 mg/dL (ref 6–23)
CO2: 30 mEq/L (ref 19–32)
Calcium: 9.3 mg/dL (ref 8.4–10.5)
Chloride: 100 mEq/L (ref 96–112)
Creatinine, Ser: 0.84 mg/dL (ref 0.4–1.5)
GFR calc Af Amer: >60 mL/min (ref >60)
GFR calc non Af Amer: >60 mL/min (ref >60)
Glucose, Bld: 237 mg/dL — ABNORMAL HIGH (ref 70–99)
Potassium: 4.4 mEq/L (ref 3.5–5.1)
Sodium: 136 mEq/L (ref 135–145)

## 2011-02-14 ENCOUNTER — Other Ambulatory Visit: Payer: Self-pay

## 2011-02-14 MED ORDER — OXYCODONE-ACETAMINOPHEN 7.5-500 MG PO TABS: 1.0000 | ORAL_TABLET | Freq: Three times a day (TID) | ORAL | Status: DC

## 2011-02-18 LAB — DIFFERENTIAL
Basophils Absolute: 0 10*3/uL (ref 0.0–0.1)
Basophils Relative: 0 % (ref 0–1)
Eosinophils Absolute: 0.1 10*3/uL (ref 0.0–0.7)
Eosinophils Relative: 1 % (ref 0–5)
Monocytes Absolute: 0.9 10*3/uL (ref 0.1–1.0)
Monocytes Relative: 5 % (ref 3–12)
Neutro Abs: 13.3 10*3/uL — ABNORMAL HIGH (ref 1.7–7.7)

## 2011-02-18 LAB — URINALYSIS, ROUTINE W REFLEX MICROSCOPIC
Bilirubin Urine: NEGATIVE
Nitrite: NEGATIVE
Specific Gravity, Urine: 1.015 (ref 1.005–1.030)
pH: 8 (ref 5.0–8.0)

## 2011-02-18 LAB — COMPREHENSIVE METABOLIC PANEL
ALT: 32 U/L (ref 0–53)
AST: 18 U/L (ref 0–37)
Albumin: 3.7 g/dL (ref 3.5–5.2)
Alkaline Phosphatase: 82 U/L (ref 39–117)
BUN: 8 mg/dL (ref 6–23)
Chloride: 102 mEq/L (ref 96–112)
Potassium: 4 mEq/L (ref 3.5–5.1)
Sodium: 138 mEq/L (ref 135–145)
Total Bilirubin: 0.5 mg/dL (ref 0.3–1.2)
Total Protein: 7 g/dL (ref 6.0–8.3)

## 2011-02-18 LAB — URINE MICROSCOPIC-ADD ON

## 2011-02-18 LAB — CBC
HCT: 51.1 % (ref 39.0–52.0)
Platelets: 153 10*3/uL (ref 150–400)
WBC: 15.9 10*3/uL — ABNORMAL HIGH (ref 4.0–10.5)

## 2011-03-07 ENCOUNTER — Other Ambulatory Visit: Payer: Self-pay

## 2011-03-07 MED ORDER — OXYCODONE-ACETAMINOPHEN 7.5-500 MG PO TABS: 1.0000 | ORAL_TABLET | Freq: Three times a day (TID) | ORAL | Status: DC

## 2011-03-12 ENCOUNTER — Ambulatory Visit: Payer: Self-pay | Admitting: Anesthesiology

## 2011-03-13 ENCOUNTER — Other Ambulatory Visit: Payer: Self-pay

## 2011-03-13 MED ORDER — BACLOFEN 10 MG PO TABS: 10.0000 mg | ORAL_TABLET | Freq: Two times a day (BID) | ORAL | Status: AC

## 2011-03-18 NOTE — Assessment & Plan Note (Signed)
[facility]                       [place] CARDIOLOGY OFFICE NOTE   NAME:Derrick Hinton, Derrick Hinton                   MRN:          [id number]  DATE:06/22/2007                            DOB:          1961-09-15    Derrick Hinton is a new patient referred by Dr. Lodema Hong for an abnormal  EKG and multiple coronary risk factors, question the need for stress  test.   Derrick Hinton is 50 years old.  He has been a diabetic for over 10  years.  He is not on insulin, but is on 4 oral hypoglycemics.  He also  has significant hypertension with hyperlipidemia.  He has a brother who  had a myocardial infarction at the age of 32.  The patient indicates  that he had a stress test 4 to 5 years ago, but nothing recently.  His  activity level is limited by chronic back problems.  He has had recent  fusion by Dr. Franky Macho and this limits his ability to get around.  He  does have some exertional dyspnea, but no chest pain.  In regard to his  EKG, I do not have an old one to compare it to.  There was a report from  a stress test back in December of 2003, which indicated that the patient  exercised for 9 minutes on a [place] protocol with PVCs and no ischemia.  This would appear to have been a non-nuclear stress test.   The patient denies a history of myocardial infarction.  He has no PND or  orthopnea.  There is no lower extremity edema.  With regard to his  diabetes, he does not have end-organ damage in terms of kidney problems,  eye disease, or neuropathy.   He has been compliant with his medications.   REVIEW OF SYSTEMS:  Otherwise, remarkable for some pain in his feet,  which may represent plantar fasciitis.  He has an appointment with a  foot doctor.  Otherwise, negative.   MEDICATIONS:  1. Celebrex 200 b.i.d.  2. Monopril hydrochlorothiazide 15/25.  3. Loratadine 10 a day.  4. Actos 45 a day.  5. Glipizide 10 b.i.d.  6. Simvastatin 80 a day.  7. Gabapentin 100 a  day.  8. Metanx daily.  9. Januvia 100 a day.  10.Metformin 1000 b.i.d.   He denies any allergies.   FAMILY HISTORY:  As indicated.  Remarkable for his brother having an MI  at the age of 1.   He has had multiple previous back surgeries, most recently a fusion in  March 2008.  Status post appendectomy.  Status post cyst removal from  the left lung.  He has diabetes, high blood pressure, and  hyperlipidemia.   He is a nonsmoker.  The patient is on disability.  He is currently not  working.  He enjoys fishing.  He does walk a little bit.  He is divorced  with 3 children.  He does not drink or smoke.   EXAM:  Remarkable for a blood pressure 128/76, pulse 92 and regular.  He  is afebrile.  Respiratory rate is 14, weight is 223.  Affect  is appropriate.  HEENT:  Normal.  NECK:  Supple.  There is no lymphadenopathy.  No JVP elevation.  No  carotid bruits.  No thyromegaly.  LUNGS:  Clear with good diaphragmatic motion.  No wheezing.  There is an S1, S2.  Normal heart sounds.  PMI is normal.  Abdomen is protuberant.  He is status post appendectomy.  Bowel sounds  positive.  No AAA.  No tenderness.  No hepatosplenomegaly or  hepatojugular reflux.  No bruits.  Femorals are +3 bilaterally.  PTs are +2.  There is no lower extremity  edema.  No neuropathy.  The neurological exam is nonfocal.  There is no muscular  weakness.  SKIN:  Warm and dry.  Affect is appropriate.   His EKG shows sinus rhythm.  There are Q waves in III and F with poor R  wave progression, read by the computer as cannot rule out an anterior  wall MI.   IMPRESSION:  1. Abnormal EKG in a diabetic with multiple coronary risk factors,      including a brother who just had a myocardial infarction.  I agree      with followup stress testing.  The patient does not think he can      walk.  We will order an adenosine Myoview on him.  2. Abnormal EKG may also represent a previous infarction.  I think      that we will do  his Myoview for starters.  So long as his left      ventricular function is normal, he will not need an echocardiogram.      There is a question of Q waves in III and F and poor R wave      progression, and if his Myoview shows an abnormal ejection fraction      he will need an echo for regional wall motion abnormalities.  3. Hypertension.  Currently well controlled.  Continue with current      medications, including ACE inhibitor with regard to his diabetes.  4. Hyperlipidemia.  Continue high dose simvastatin.  Follow up lipid      and liver profile in 6 months.  5. Significant diabetes on 4-drug therapy.  Follow hemoglobin A1c and      continue diabetic low-salt diet.  6. Arthritis.  Continue Celebrex 200 b.i.d.  It is particularly      important to rule out coronary artery disease since he tends to      rely on Celebrex for his joint pains.   Further recommendations based on the results of his stress test.     Theron Arista C. Eden Emms, MD, [facility]  Electronically Signed    PCN/MedQ  DD: 06/22/2007  DT: 06/23/2007  Job #: [id number]   cc:   Milus Mallick. Lodema Hong, M.D.
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
## 2011-03-21 NOTE — Procedures (Signed)
   NAME:  Derrick Hinton, Derrick Hinton                      ACCOUNT NO.:  [id number]   MEDICAL RECORD NO.:  [id number]                   PATIENT TYPE:  OUT   LOCATION:  [facility]                                 FACILITY:  [facility]   PHYSICIAN:  Edward L. Juanetta Gosling, M.D.             DATE OF BIRTH:  [birth year]   DATE OF PROCEDURE:  10/14/2002  DATE OF DISCHARGE:                                    STRESS TEST   INDICATIONS FOR PROCEDURE:  The patient has hypertension and diabetes and is  undergoing graded exercise test to rule out ischemic cardiac disease.  There  are no contraindications to graded exercise testing.   The patient exercised for 9 minutes on the [place] protocol reaching and  sustaining 10.1 METS.  His maximum recorded heart rate was 158, which is 89%  of the age-predicted maximal heart rate.  Blood pressure response to  exercise was slightly exaggerated.  He had no chest pain during exercise and  no electrocardiographic changes suggestive of inducible ischemia.  He had no  symptoms during exercise.  He had had PVC's before and postexercise but none  during exercise.   IMPRESSION:  1. Good exercise tolerance.  2. Slightly exaggerated blood pressure response to exercise.  3. No symptoms during exercise.  4. No evidence of inducible ischemia.  5. Premature ventricular contractions pre- and postexercise but none during.                                               Edward L. Juanetta Gosling, M.D.    EL[facility]/MEDQ  D:  10/14/2002  T:  10/14/2002  Job:  [id number]   cc:   Angus G. Renard Matter, M.D.  [address]  [place]  [place] [address]  Fax: [phone]

## 2011-03-26 ENCOUNTER — Other Ambulatory Visit: Payer: Self-pay

## 2011-03-26 MED ORDER — ETODOLAC 500 MG PO TABS: 500.0000 mg | ORAL_TABLET | Freq: Two times a day (BID) | ORAL | Status: DC

## 2011-03-26 MED ORDER — RANITIDINE HCL 150 MG PO CAPS: 150.0000 mg | ORAL_CAPSULE | Freq: Two times a day (BID) | ORAL | Status: DC

## 2011-03-26 MED ORDER — AMLODIPINE BESYLATE-VALSARTAN 10-320 MG PO TABS: 1.0000 | ORAL_TABLET | Freq: Every day | ORAL | Status: DC

## 2011-04-09 ENCOUNTER — Telehealth: Payer: Self-pay | Admitting: Family Medicine

## 2011-04-09 ENCOUNTER — Ambulatory Visit: Payer: Self-pay | Admitting: Anesthesiology

## 2011-04-09 MED ORDER — INSULIN DETEMIR 100 UNIT/ML ~~LOC~~ SOLN: 65.0000 [IU] | Freq: Two times a day (BID) | SUBCUTANEOUS | Status: DC

## 2011-04-09 NOTE — Telephone Encounter (Signed)
Med sent as requested 

## 2011-04-09 NOTE — Telephone Encounter (Signed)
Need to know name of med, on two pen insulins, called patient, left message

## 2011-04-15 ENCOUNTER — Other Ambulatory Visit: Payer: Self-pay

## 2011-04-18 ENCOUNTER — Other Ambulatory Visit: Payer: Self-pay

## 2011-04-18 MED ORDER — OXYCODONE-ACETAMINOPHEN 7.5-500 MG PO TABS: 1.0000 | ORAL_TABLET | Freq: Three times a day (TID) | ORAL | Status: DC

## 2011-04-30 ENCOUNTER — Encounter: Payer: Self-pay | Admitting: Family Medicine

## 2011-05-01 ENCOUNTER — Other Ambulatory Visit: Payer: Self-pay | Admitting: Family Medicine

## 2011-05-01 ENCOUNTER — Encounter: Payer: Self-pay | Admitting: Family Medicine

## 2011-05-01 ENCOUNTER — Ambulatory Visit (INDEPENDENT_AMBULATORY_CARE_PROVIDER_SITE_OTHER): Payer: Medicare PPO | Admitting: Family Medicine

## 2011-05-01 VITALS — BP 148/90 | HR 68 | Resp 16 | Ht 71.0 in | Wt 232.4 lb

## 2011-05-01 DIAGNOSIS — J309 Allergic rhinitis, unspecified: Secondary | ICD-10-CM

## 2011-05-01 DIAGNOSIS — F17200 Nicotine dependence, unspecified, uncomplicated: Secondary | ICD-10-CM

## 2011-05-01 DIAGNOSIS — J329 Chronic sinusitis, unspecified: Secondary | ICD-10-CM | POA: Insufficient documentation

## 2011-05-01 DIAGNOSIS — E119 Type 2 diabetes mellitus without complications: Secondary | ICD-10-CM

## 2011-05-01 DIAGNOSIS — R51 Headache: Secondary | ICD-10-CM

## 2011-05-01 DIAGNOSIS — M79646 Pain in unspecified finger(s): Secondary | ICD-10-CM

## 2011-05-01 DIAGNOSIS — I10 Essential (primary) hypertension: Secondary | ICD-10-CM

## 2011-05-01 DIAGNOSIS — M549 Dorsalgia, unspecified: Secondary | ICD-10-CM

## 2011-05-01 DIAGNOSIS — M79609 Pain in unspecified limb: Secondary | ICD-10-CM

## 2011-05-01 DIAGNOSIS — E785 Hyperlipidemia, unspecified: Secondary | ICD-10-CM

## 2011-05-01 MED ORDER — FLUTICASONE PROPIONATE 50 MCG/ACT NA SUSP: 1.0000 | Freq: Every day | NASAL | Status: DC

## 2011-05-01 MED ORDER — PENICILLIN V POTASSIUM 500 MG PO TABS: 500.0000 mg | ORAL_TABLET | Freq: Three times a day (TID) | ORAL | Status: AC

## 2011-05-01 NOTE — Patient Instructions (Signed)
F/u in 3 months. You  Need to avoid heat, and wear a mask when mowing.  pls start a nose spray for allergies, and you will be treated for sinus infection  If the headache gets too severe or you develop weakness, numbness or blurred vision pls go to the nearest ED  You are being referred to orthopedics about your thumb in [place]  You will be referred to diabetes doc in  if your blood sugar is still high

## 2011-05-01 NOTE — Progress Notes (Signed)
  Subjective:    Patient ID: Derrick Hinton, male    DOB: 09-Oct-1961, 50 y.o.   MRN: [id number]  HPI 2 month h/o popping in right thumb, then became stiff with reduced mobility and stiffness, also concerned about similar symptoms in the index, wants this checked and has ortho in [place] who has seen the right elbow in the past  Tests the blood sugar 4 times daily and reports near perfect numbers Sinus headaches x 3 weeks, no fever or chills, frontal headace , pounding x 3 weeks, currently a 2, states heat triggers the headache , denies weakness, numbness, blurred vision. Also states that when he is out mowing with excessive exposure to the grass, he gets a headache   Review of Systems See HPI Denies recent fever or chills. Reports sinus pressure and nasal congestion, with excessive heat and grass exposure , then subsequent headache Denies chest congestion, productive cough or wheezing. Denies chest pains, palpitations, paroxysmal nocturnal dyspnea, orthopnea and leg swelling Denies abdominal pain, nausea, vomiting,diarrhea or constipation.  Denies rectal bleeding or change in bowel movement. Denies dysuria, frequency, hesitancy or incontinence.His ED responds to med Chronic back pain with reduced mobility, symptoms have improved since recent injection Denies  seizure, numbness, or tingling. Denies depression,or uncontrolled  anxiety or insomnia.reports excessive sleepiness with clonodine , therefore unable to up titrate the dose, wll consider hydrallazine Denies skin break down or rash.        Objective:   Physical Exam Patient alert and oriented and in no Cardiopulmonary distress.  HEENT: No facial asymmetry, EOMI, no sinus tenderness, TM's clear, Oropharynx pink and moist.  Neck supple no adenopathy.  Chest: Clear to auscultation bilaterally.Decreased air entry bilaterally  CVS: S1, S2 no murmurs, no S3.  ABD: Soft non tender. Bowel sounds normal.  Ext: No edema  MS:  decreased  ROM spine,adequate in  shoulders, hips and knees.Right thumb tender and swollen with reduced mobility  Skin: Intact, no ulcerations or rash noted.  Psych: Good eye contact, normal affect. Memory intact not anxious or depressed appearing.  CNS: CN 2-12 intact, power and  tone normal throughout.decreased sensation in feet        Assessment & Plan:

## 2011-05-02 LAB — HEPATIC FUNCTION PANEL
ALT: 34 U/L (ref 0–53)
Alkaline Phosphatase: 93 U/L (ref 39–117)
Bilirubin, Direct: 0.2 mg/dL (ref 0.0–0.3)
Indirect Bilirubin: 0.3 mg/dL (ref 0.0–0.9)
Total Protein: 7 g/dL (ref 6.0–8.3)

## 2011-05-02 LAB — LIPID PANEL
Cholesterol: 106 mg/dL (ref 0–200)
Triglycerides: 105 mg/dL (ref <150)

## 2011-05-02 LAB — BASIC METABOLIC PANEL
BUN: 11 mg/dL (ref 6–23)
Calcium: 9.4 mg/dL (ref 8.4–10.5)
Creat: 0.79 mg/dL (ref 0.50–1.35)
Glucose, Bld: 128 mg/dL — ABNORMAL HIGH (ref 70–99)

## 2011-05-02 LAB — HEMOGLOBIN A1C: Mean Plasma Glucose: 174 mg/dL — ABNORMAL HIGH (ref <117)

## 2011-05-04 ENCOUNTER — Encounter: Payer: Self-pay | Admitting: Family Medicine

## 2011-05-04 DIAGNOSIS — R51 Headache: Secondary | ICD-10-CM | POA: Insufficient documentation

## 2011-05-04 DIAGNOSIS — R519 Headache, unspecified: Secondary | ICD-10-CM | POA: Insufficient documentation

## 2011-05-04 DIAGNOSIS — F17200 Nicotine dependence, unspecified, uncomplicated: Secondary | ICD-10-CM | POA: Insufficient documentation

## 2011-05-04 DIAGNOSIS — M79646 Pain in unspecified finger(s): Secondary | ICD-10-CM | POA: Insufficient documentation

## 2011-05-04 NOTE — Assessment & Plan Note (Signed)
improved , however needs to remain on chronic medication

## 2011-05-04 NOTE — Assessment & Plan Note (Signed)
Controlled, no change in medication  

## 2011-05-04 NOTE — Assessment & Plan Note (Signed)
New thumb pan with reduced mobility, refer to ortho

## 2011-05-04 NOTE — Assessment & Plan Note (Signed)
Uncontrolled and a source of headaches, trigger avoidance stressed

## 2011-05-04 NOTE — Assessment & Plan Note (Signed)
Uncontrolled, will add hydralazine at next visit

## 2011-05-04 NOTE — Assessment & Plan Note (Signed)
No focal deficits on exam, history consistent with uncontrolled allergies as the trigger, pt advised to go to ed if symptoms worsen, or new ones develop

## 2011-05-04 NOTE — Assessment & Plan Note (Signed)
Use has decreased over time, unwililng to set a quit date at this time

## 2011-05-04 NOTE — Assessment & Plan Note (Signed)
Uncontrolled, if no improvement, will refer endocrine

## 2011-05-19 ENCOUNTER — Other Ambulatory Visit: Payer: Self-pay

## 2011-05-19 MED ORDER — OXYCODONE-ACETAMINOPHEN 7.5-500 MG PO TABS: 1.0000 | ORAL_TABLET | Freq: Three times a day (TID) | ORAL | Status: DC

## 2011-05-20 ENCOUNTER — Ambulatory Visit: Payer: Self-pay | Admitting: Anesthesiology

## 2011-06-05 ENCOUNTER — Other Ambulatory Visit: Payer: Self-pay | Admitting: Family Medicine

## 2011-06-20 ENCOUNTER — Other Ambulatory Visit: Payer: Self-pay

## 2011-06-20 MED ORDER — OXYCODONE-ACETAMINOPHEN 7.5-500 MG PO TABS: 1.0000 | ORAL_TABLET | Freq: Three times a day (TID) | ORAL | Status: DC

## 2011-06-23 ENCOUNTER — Ambulatory Visit: Payer: Self-pay | Admitting: Anesthesiology

## 2011-07-02 ENCOUNTER — Ambulatory Visit: Payer: Self-pay | Admitting: Anesthesiology

## 2011-07-18 ENCOUNTER — Other Ambulatory Visit: Payer: Self-pay

## 2011-07-18 MED ORDER — OXYCODONE-ACETAMINOPHEN 7.5-500 MG PO TABS: 1.0000 | ORAL_TABLET | Freq: Three times a day (TID) | ORAL | Status: DC

## 2011-07-23 ENCOUNTER — Telehealth: Payer: Self-pay | Admitting: Family Medicine

## 2011-07-23 NOTE — Telephone Encounter (Signed)
Advised script is available for pickup

## 2011-07-29 LAB — URINALYSIS, ROUTINE W REFLEX MICROSCOPIC
Glucose, UA: NEGATIVE
Leukocytes, UA: NEGATIVE
Protein, ur: 30 — AB
Specific Gravity, Urine: 1.02
Urobilinogen, UA: 0.2

## 2011-07-29 LAB — URINE MICROSCOPIC-ADD ON

## 2011-07-29 LAB — URINE CULTURE: Culture: NO GROWTH

## 2011-08-04 LAB — BASIC METABOLIC PANEL
Calcium: 9.4
Creatinine, Ser: 1.03
GFR calc Af Amer: >60
GFR calc non Af Amer: >60
Sodium: 135

## 2011-08-04 LAB — DIFFERENTIAL
Basophils Absolute: 0.1
Basophils Relative: 0
Lymphocytes Relative: 13
Monocytes Absolute: 0.7
Monocytes Relative: 5
Neutro Abs: 11.3 — ABNORMAL HIGH
Neutrophils Relative %: 81 — ABNORMAL HIGH

## 2011-08-04 LAB — URINALYSIS, ROUTINE W REFLEX MICROSCOPIC
Bilirubin Urine: NEGATIVE
Nitrite: NEGATIVE
Specific Gravity, Urine: >1.03 — ABNORMAL HIGH
Urobilinogen, UA: 0.2
pH: 5.5

## 2011-08-04 LAB — CBC
Hemoglobin: 16
MCHC: 32.5
RBC: 5.52

## 2011-08-04 LAB — GC/CHLAMYDIA PROBE AMP, URINE: GC Probe Amp, Urine: NEGATIVE

## 2011-08-04 LAB — URINE MICROSCOPIC-ADD ON

## 2011-08-07 ENCOUNTER — Encounter: Payer: Self-pay | Admitting: Family Medicine

## 2011-08-12 ENCOUNTER — Encounter: Payer: Self-pay | Admitting: Family Medicine

## 2011-08-12 ENCOUNTER — Ambulatory Visit (INDEPENDENT_AMBULATORY_CARE_PROVIDER_SITE_OTHER): Payer: Medicare PPO | Admitting: Family Medicine

## 2011-08-12 VITALS — BP 128/88 | HR 74 | Resp 16 | Ht 71.0 in | Wt 240.0 lb

## 2011-08-12 DIAGNOSIS — I10 Essential (primary) hypertension: Secondary | ICD-10-CM

## 2011-08-12 DIAGNOSIS — F17200 Nicotine dependence, unspecified, uncomplicated: Secondary | ICD-10-CM

## 2011-08-12 DIAGNOSIS — E785 Hyperlipidemia, unspecified: Secondary | ICD-10-CM

## 2011-08-12 DIAGNOSIS — E119 Type 2 diabetes mellitus without complications: Secondary | ICD-10-CM

## 2011-08-12 DIAGNOSIS — J40 Bronchitis, not specified as acute or chronic: Secondary | ICD-10-CM

## 2011-08-12 DIAGNOSIS — M549 Dorsalgia, unspecified: Secondary | ICD-10-CM

## 2011-08-12 DIAGNOSIS — J449 Chronic obstructive pulmonary disease, unspecified: Secondary | ICD-10-CM

## 2011-08-12 LAB — BASIC METABOLIC PANEL
CO2: 28 mEq/L (ref 19–32)
Chloride: 105 mEq/L (ref 96–112)
Potassium: 4.8 mEq/L (ref 3.5–5.3)
Sodium: 140 mEq/L (ref 135–145)

## 2011-08-12 LAB — HEMOGLOBIN A1C: Mean Plasma Glucose: 177 mg/dL — ABNORMAL HIGH (ref <117)

## 2011-08-12 MED ORDER — BENAZEPRIL HCL 40 MG PO TABS: 40.0000 mg | ORAL_TABLET | Freq: Every day | ORAL | Status: DC

## 2011-08-12 MED ORDER — GABAPENTIN 300 MG PO CAPS: ORAL_CAPSULE | ORAL | Status: DC

## 2011-08-12 MED ORDER — INSULIN DETEMIR 100 UNIT/ML ~~LOC~~ SOLN: 65.0000 [IU] | Freq: Two times a day (BID) | SUBCUTANEOUS | Status: DC

## 2011-08-12 MED ORDER — ETODOLAC 500 MG PO TABS: 500.0000 mg | ORAL_TABLET | Freq: Two times a day (BID) | ORAL | Status: DC

## 2011-08-12 MED ORDER — GLIPIZIDE 10 MG PO TABS: ORAL_TABLET | ORAL | Status: DC

## 2011-08-12 MED ORDER — SITAGLIPTIN PHOS-METFORMIN HCL 50-1000 MG PO TABS: 1.0000 | ORAL_TABLET | Freq: Two times a day (BID) | ORAL | Status: DC

## 2011-08-12 MED ORDER — IPRATROPIUM-ALBUTEROL 0.5-2.5 (3) MG/3ML IN SOLN: 3.0000 mL | Freq: Four times a day (QID) | RESPIRATORY_TRACT | Status: DC | PRN

## 2011-08-12 MED ORDER — OXYCODONE-ACETAMINOPHEN 7.5-500 MG PO TABS: 1.0000 | ORAL_TABLET | Freq: Three times a day (TID) | ORAL | Status: DC

## 2011-08-12 MED ORDER — BENZONATATE 100 MG PO CAPS: 100.0000 mg | ORAL_CAPSULE | Freq: Four times a day (QID) | ORAL | Status: DC | PRN

## 2011-08-12 MED ORDER — AMLODIPINE BESYLATE-VALSARTAN 10-320 MG PO TABS: 1.0000 | ORAL_TABLET | Freq: Every day | ORAL | Status: DC

## 2011-08-12 MED ORDER — INSULIN LISPRO 100 UNIT/ML ~~LOC~~ SOLN: SUBCUTANEOUS | Status: DC

## 2011-08-12 MED ORDER — CLONIDINE HCL 0.3 MG PO TABS: ORAL_TABLET | ORAL | Status: DC

## 2011-08-12 MED ORDER — ROSUVASTATIN CALCIUM 20 MG PO TABS: ORAL_TABLET | ORAL | Status: DC

## 2011-08-12 MED ORDER — RANITIDINE HCL 150 MG PO CAPS: 150.0000 mg | ORAL_CAPSULE | Freq: Two times a day (BID) | ORAL | Status: DC

## 2011-08-12 MED ORDER — PENICILLIN V POTASSIUM 500 MG PO TABS: 500.0000 mg | ORAL_TABLET | Freq: Three times a day (TID) | ORAL | Status: AC

## 2011-08-12 MED ORDER — BACLOFEN 10 MG PO TABS: 10.0000 mg | ORAL_TABLET | Freq: Two times a day (BID) | ORAL | Status: DC

## 2011-08-12 NOTE — Patient Instructions (Addendum)
F/u in 4 months Flu vaccine in next 3 to 4 weeks Labs today.  Fasting labs before next visit in 4 months.  You are being treated for bronchitis.   Please think about quitting smoking.  This is very important for your health.  Consider setting a quit date, then cutting back or switching brands to prepare to stop.  Also think of the money you will save every day by not smoking.  Quick Tips to Quit Smoking: Fix a date i.e. keep a date in mind from when you would not touch a tobacco product to smoke  Keep yourself busy and block your mind with work loads or reading books or watching movies in malls where smoking is not allowed  Vanish off the things which reminds you about smoking for example match box, or your favorite lighter, or the pipe you used for smoking, or your favorite jeans and shirt with which you used to enjoy smoking, or the club where you used to do smoking  Try to avoid certain people places and incidences where and with whom smoking is a common factor to add on  Praise yourself with some token gifts from the money you saved by stopping smoking  Anti Smoking teams are there to help you. Join their programs  Anti-smoking Gums are there in many medical shops. Try them to quit smoking   Side-effects of Smoking: Disease caused by smoking cigarettes are emphysema, bronchitis, heart failures  Premature death  Cancer is the major side effect of smoking  Heart attacks and strokes are the quick effects of smoking causing sudden death  Some smokers lives end up with limbs amputated  Breathing problem or fast breathing is another side effect of smoking  Due to more intakes of smokes, carbon mono-oxide goes into your brain and other muscles of the body which leads to swelling of the veins and blockage to the air passage to lungs  Carbon monoxide blocks blood vessels which leads to blockage in the flow of blood to different major body organs like heart lungs and thus leads to attacks and  deaths  During pregnancy smoking is very harmful and leads to premature birth of the infant, spontaneous abortions, low weight of the infant during birth  Fat depositions to narrow and blocked blood vessels causing heart attacks  In many cases cigarette smoking caused infertility in men   Blood pressure is good, no med change

## 2011-08-13 ENCOUNTER — Other Ambulatory Visit: Payer: Self-pay | Admitting: Family Medicine

## 2011-08-13 ENCOUNTER — Telehealth: Payer: Self-pay | Admitting: Family Medicine

## 2011-08-13 LAB — MICROALBUMIN / CREATININE URINE RATIO
Creatinine, Urine: 164.3 mg/dL
Microalb Creat Ratio: 1349.3 mg/g — ABNORMAL HIGH (ref 0.0–30.0)

## 2011-08-13 NOTE — Progress Notes (Signed)
  Subjective:    Patient ID: Derrick Hinton, male    DOB: 1961-04-29, 50 y.o.   MRN: [id number]  HPI The PT is here for follow up and re-evaluation of chronic medical conditions, medication management and review of any available recent lab and radiology data.  Preventive health is updated, specifically  Cancer screening and Immunization.   Questions or concerns regarding consultations or procedures which the PT has had in the interim are  addressed. The PT denies any adverse reactions to current medications since the last visit.  There are no new concerns.  Continues to experience chronic back and lower extremity pain. He does get epidural injections and goes to a pain clinic for this. Blood sugars are seldom under 150 in the mornings, veery infrequently he does experience hypoglycemia down to the 60's primarily early morning   Review of Systems See HPI Denies recent fever or chills. Denies sinus pressure, nasal congestion, ear pain or sore throat. 2 week h/o chest congestion and  productive cough denies wheezing wheezing.he continues to smoke. Denies chest pains, palpitations and leg swelling Denies abdominal pain, nausea, vomiting,diarrhea or constipation.   Denies dysuria, frequency, hesitancy or incontinence.  Denies headaches, seizures, numbness, or tingling. Denies depression, anxiety or insomnia. Denies skin break down or rash.        Objective:   Physical Exam Patient alert and oriented and in no cardiopulmonary distress.  HEENT: No facial asymmetry, EOMI, no sinus tenderness,  oropharynx pink and moist.  Neck supple no adenopathy.  Chest: decreased air entry scattered crackles, no wheezes  CVS: S1, S2 no murmurs, no S3.  ABD: Soft non tender. Bowel sounds normal.  Ext: No edema  MS: decreased  ROM spine, shoulders, hips and knees.  Skin: Intact, no ulcerations or rash noted.  Psych: Good eye contact, normal affect. Memory intact not anxious or depressed  appearing.  CNS: CN 2-12 intact, power, tone and sensation normal throughout.        Assessment & Plan:

## 2011-08-13 NOTE — Assessment & Plan Note (Signed)
Sub optimal control, pt already on multiple medications, no changes at this time, importance of DASH diet and regular exercise stressed

## 2011-08-13 NOTE — Assessment & Plan Note (Signed)
Uncontrolled with deterioration in renal function, will refer to endocrine and nephrology

## 2011-08-13 NOTE — Assessment & Plan Note (Signed)
Unchanged , cessation counseling done 

## 2011-08-13 NOTE — Assessment & Plan Note (Signed)
Antibiotics and decongestants prescribed 

## 2011-08-15 ENCOUNTER — Ambulatory Visit: Payer: Self-pay | Admitting: Anesthesiology

## 2011-08-15 ENCOUNTER — Other Ambulatory Visit: Payer: Self-pay | Admitting: *Deleted

## 2011-08-15 NOTE — Telephone Encounter (Signed)
Spoke directly with pt advised him to stop etodolac, and also of the need to see diabetes specialist and also nephrologist, he understands and agrees

## 2011-08-19 ENCOUNTER — Other Ambulatory Visit: Payer: Self-pay

## 2011-08-19 NOTE — Progress Notes (Signed)
Patient aware. He already has appointments with endocrinologist and nephrologist.

## 2011-09-04 ENCOUNTER — Telehealth: Payer: Self-pay | Admitting: Family Medicine

## 2011-09-04 NOTE — Telephone Encounter (Signed)
Should we be refilling? Or Dr Dennie Bible office?

## 2011-09-05 ENCOUNTER — Ambulatory Visit (INDEPENDENT_AMBULATORY_CARE_PROVIDER_SITE_OTHER): Payer: Medicare PPO

## 2011-09-05 DIAGNOSIS — Z23 Encounter for immunization: Secondary | ICD-10-CM

## 2011-09-05 NOTE — Telephone Encounter (Signed)
Patient aware.

## 2011-09-05 NOTE — Telephone Encounter (Signed)
pls explain to pt now that Dr Fransico Him is treating his diabetes, he needs to get all prescriptions from him for the diabetes, so he needs to call dr nida's office about this

## 2011-09-15 ENCOUNTER — Other Ambulatory Visit: Payer: Self-pay

## 2011-09-15 MED ORDER — OXYCODONE-ACETAMINOPHEN 7.5-500 MG PO TABS: 1.0000 | ORAL_TABLET | Freq: Three times a day (TID) | ORAL | Status: DC

## 2011-09-29 ENCOUNTER — Other Ambulatory Visit (HOSPITAL_COMMUNITY): Payer: Self-pay | Admitting: Nephrology

## 2011-09-29 DIAGNOSIS — N289 Disorder of kidney and ureter, unspecified: Secondary | ICD-10-CM

## 2011-10-06 ENCOUNTER — Ambulatory Visit: Payer: Self-pay | Admitting: Anesthesiology

## 2011-10-09 ENCOUNTER — Ambulatory Visit (HOSPITAL_COMMUNITY)
Admission: RE | Admit: 2011-10-09 | Discharge: 2011-10-09 | Disposition: A | Discharge status: Home / Self Care | Payer: Medicare PPO | Source: Ambulatory Visit | Attending: Nephrology | Admitting: Nephrology

## 2011-10-09 DIAGNOSIS — N289 Disorder of kidney and ureter, unspecified: Secondary | ICD-10-CM | POA: Insufficient documentation

## 2011-10-10 ENCOUNTER — Other Ambulatory Visit: Payer: Self-pay

## 2011-10-10 MED ORDER — OXYCODONE-ACETAMINOPHEN 7.5-500 MG PO TABS: 1.0000 | ORAL_TABLET | Freq: Three times a day (TID) | ORAL | Status: DC

## 2011-11-06 ENCOUNTER — Ambulatory Visit: Payer: Self-pay | Admitting: Anesthesiology

## 2011-11-07 ENCOUNTER — Other Ambulatory Visit: Payer: Self-pay

## 2011-11-07 MED ORDER — OXYCODONE-ACETAMINOPHEN 7.5-500 MG PO TABS: 1.0000 | ORAL_TABLET | Freq: Three times a day (TID) | ORAL | Status: DC

## 2011-11-25 ENCOUNTER — Encounter: Payer: Self-pay | Admitting: Family Medicine

## 2011-12-08 ENCOUNTER — Other Ambulatory Visit: Payer: Self-pay

## 2011-12-08 MED ORDER — OXYCODONE-ACETAMINOPHEN 7.5-500 MG PO TABS: 1.0000 | ORAL_TABLET | Freq: Three times a day (TID) | ORAL | Status: DC

## 2011-12-15 ENCOUNTER — Encounter: Payer: Self-pay | Admitting: Family Medicine

## 2011-12-15 ENCOUNTER — Ambulatory Visit (INDEPENDENT_AMBULATORY_CARE_PROVIDER_SITE_OTHER): Payer: Medicare PPO | Admitting: Family Medicine

## 2011-12-15 VITALS — BP 122/82 | HR 108 | Resp 16 | Ht 71.0 in | Wt 236.0 lb

## 2011-12-15 DIAGNOSIS — R5383 Other fatigue: Secondary | ICD-10-CM

## 2011-12-15 DIAGNOSIS — E119 Type 2 diabetes mellitus without complications: Secondary | ICD-10-CM

## 2011-12-15 DIAGNOSIS — E669 Obesity, unspecified: Secondary | ICD-10-CM

## 2011-12-15 DIAGNOSIS — E785 Hyperlipidemia, unspecified: Secondary | ICD-10-CM

## 2011-12-15 DIAGNOSIS — I10 Essential (primary) hypertension: Secondary | ICD-10-CM

## 2011-12-15 DIAGNOSIS — R5381 Other malaise: Secondary | ICD-10-CM

## 2011-12-15 DIAGNOSIS — M549 Dorsalgia, unspecified: Secondary | ICD-10-CM

## 2011-12-15 DIAGNOSIS — Z125 Encounter for screening for malignant neoplasm of prostate: Secondary | ICD-10-CM

## 2011-12-15 MED ORDER — BACLOFEN 10 MG PO TABS: 10.0000 mg | ORAL_TABLET | Freq: Two times a day (BID) | ORAL | Status: DC

## 2011-12-15 MED ORDER — SITAGLIPTIN PHOS-METFORMIN HCL 50-1000 MG PO TABS: 1.0000 | ORAL_TABLET | Freq: Two times a day (BID) | ORAL | Status: DC

## 2011-12-15 MED ORDER — CLONIDINE HCL 0.3 MG PO TABS: ORAL_TABLET | ORAL | Status: DC

## 2011-12-15 MED ORDER — GABAPENTIN 300 MG PO CAPS: 300.0000 mg | ORAL_CAPSULE | Freq: Four times a day (QID) | ORAL | Status: DC

## 2011-12-15 MED ORDER — ROSUVASTATIN CALCIUM 20 MG PO TABS: ORAL_TABLET | ORAL | Status: DC

## 2011-12-15 MED ORDER — AMLODIPINE BESYLATE-VALSARTAN 10-320 MG PO TABS: 1.0000 | ORAL_TABLET | Freq: Every day | ORAL | Status: DC

## 2011-12-15 MED ORDER — OXYCODONE-ACETAMINOPHEN 10-325 MG PO TABS: ORAL_TABLET | ORAL | Status: DC

## 2011-12-15 MED ORDER — BENAZEPRIL HCL 40 MG PO TABS: 40.0000 mg | ORAL_TABLET | Freq: Every day | ORAL | Status: DC

## 2011-12-15 NOTE — Assessment & Plan Note (Signed)
Improved. Pt applauded on succesful weight loss through lifestyle change, and encouraged to continue same. Weight loss goal set for the next several months.  

## 2011-12-15 NOTE — Assessment & Plan Note (Signed)
Uncontrolled and followed by endo 

## 2011-12-15 NOTE — Patient Instructions (Signed)
F/U in July.Rectal at next visit  Fasting Lipid, cmp and EGFR, pSA, cbc and tsh June 29 or after  Dose increase on gabapentin, ok to take 2 at night and one in the morning, then inc to one in the morning , one at lunch and 2 at night.  Dose of percocet also increased.  Call about your elbow if need to see ortho for a referral if you need one  Try and get back to pain clinic.  Keep appts with Dr Fransico Him  It is important that you exercise regularly at least 30 minutes 5 times a week. If you develop chest pain, have severe difficulty breathing, or feel very tired, stop exercising immediately and seek medical attention    A healthy diet is rich in fruit, vegetables and whole grains. Poultry fish, nuts and beans are a healthy choice for protein rather then red meat. A low sodium diet and drinking 64 ounces of water daily is generally recommended. Oils and sweet should be limited. Carbohydrates especially for those who are diabetic or overweight, should be limited to 30-45 gram per meal. It is important to eat on a regular schedule, at least 3 times daily. Snacks should be primarily fruits, vegetables or nuts.  Congrats on smoking cessation, keep it up!

## 2011-12-15 NOTE — Assessment & Plan Note (Signed)
Controlled, no change in medication  

## 2011-12-15 NOTE — Assessment & Plan Note (Signed)
Low fat diet discussed and encouraged, labs due prior to next visit

## 2011-12-15 NOTE — Progress Notes (Signed)
  Subjective:    Patient ID: Derrick Hinton, male    DOB: 11/12/1960, 51 y.o.   MRN: [id number]  HPI The PT is here for follow up and re-evaluation of chronic medical conditions, medication management and review of any available recent lab and radiology data.  Preventive health is updated, specifically  Cancer screening and Immunization.   Questions or concerns regarding consultations or procedures which the PT has had in the interim are  addressed. The PT denies any adverse reactions to current medications since the last visit.  C/o uncontrolled back pain x 2 months radiating down both lower ext, has had epidural in the Fall, waiting for ins approval for rept injection Left elbow tenderness x 2 weeks, no direct trauma. Tests sugar 3 to 4 times daily, now followed by endo Remains nicotine free since 08/20/2011      Review of Systems See HPI Denies recent fever or chills. Denies sinus pressure, nasal congestion, ear pain or sore throat. Denies chest congestion, productive cough or wheezing. Denies chest pains, palpitations and leg swelling Denies abdominal pain, nausea, vomiting,diarrhea or constipation.   Denies dysuria, frequency, hesitancy or incontinence. Denies headaches, seizures, numbness, or tingling. Denies depression, anxiety or insomnia. Denies skin break down or rash.        Objective:   Physical Exam  Patient alert and oriented and in no cardiopulmonary distress.  HEENT: No facial asymmetry, EOMI, no sinus tenderness,  oropharynx pink and moist.  Neck supple no adenopathy.  Chest: Clear to auscultation bilaterally.  CVS: S1, S2 no murmurs, no S3.  ABD: Soft non tender. Bowel sounds normal.  Ext: No edema  MS: Decreased  ROM spine,adequate in  shoulders, hips and knees.  Skin: Intact, no ulcerations or rash noted.  Psych: Good eye contact, normal affect. Memory intact not anxious or depressed appearing.  CNS: CN 2-12 intact, power, tone and  sensation normal throughout.       Assessment & Plan:

## 2011-12-15 NOTE — Progress Notes (Signed)
  Subjective:    Patient ID: Derrick Hinton, male    DOB: 05/17/1961, 51 y.o.   MRN: [id number]  HPI    Review of Systems     Objective:   Physical Exam        Assessment & Plan:  Vicodin dose increased indefinitely

## 2011-12-15 NOTE — Assessment & Plan Note (Signed)
Uncontrolled and worsened, inc vicodin x 2 month, inc gabapentin

## 2012-01-06 ENCOUNTER — Ambulatory Visit: Payer: Self-pay | Admitting: Anesthesiology

## 2012-01-09 ENCOUNTER — Other Ambulatory Visit: Payer: Self-pay

## 2012-01-09 DIAGNOSIS — M549 Dorsalgia, unspecified: Secondary | ICD-10-CM

## 2012-01-09 MED ORDER — OXYCODONE-ACETAMINOPHEN 10-325 MG PO TABS: ORAL_TABLET | ORAL | Status: DC

## 2012-02-13 ENCOUNTER — Other Ambulatory Visit: Payer: Self-pay

## 2012-02-13 DIAGNOSIS — M549 Dorsalgia, unspecified: Secondary | ICD-10-CM

## 2012-02-13 MED ORDER — OXYCODONE-ACETAMINOPHEN 10-325 MG PO TABS: ORAL_TABLET | ORAL | Status: DC

## 2012-02-18 ENCOUNTER — Telehealth: Payer: Self-pay | Admitting: Family Medicine

## 2012-02-18 NOTE — Telephone Encounter (Signed)
Pt aware it is ready  

## 2012-03-11 ENCOUNTER — Telehealth: Payer: Self-pay | Admitting: Family Medicine

## 2012-03-12 ENCOUNTER — Other Ambulatory Visit: Payer: Self-pay

## 2012-03-12 DIAGNOSIS — M549 Dorsalgia, unspecified: Secondary | ICD-10-CM

## 2012-03-12 MED ORDER — OXYCODONE-ACETAMINOPHEN 10-325 MG PO TABS: ORAL_TABLET | ORAL | Status: DC

## 2012-03-15 NOTE — Telephone Encounter (Signed)
Pt aware that he needs to come pick up rx in person.

## 2012-03-16 ENCOUNTER — Telehealth: Payer: Self-pay | Admitting: Family Medicine

## 2012-03-16 ENCOUNTER — Other Ambulatory Visit: Payer: Self-pay

## 2012-03-16 ENCOUNTER — Other Ambulatory Visit: Payer: Self-pay | Admitting: Family Medicine

## 2012-03-16 DIAGNOSIS — Z79899 Other long term (current) drug therapy: Secondary | ICD-10-CM

## 2012-03-16 NOTE — Telephone Encounter (Signed)
Patient changed mind

## 2012-03-17 LAB — DRUG SCREEN, URINE
Amphetamine Screen, Ur: NEGATIVE
Benzodiazepines.: NEGATIVE
Cocaine Metabolites: NEGATIVE
Marijuana Metabolite: NEGATIVE
Methadone: NEGATIVE
Phencyclidine (PCP): NEGATIVE

## 2012-03-23 ENCOUNTER — Other Ambulatory Visit (HOSPITAL_COMMUNITY): Payer: Self-pay | Admitting: Neurosurgery

## 2012-03-23 ENCOUNTER — Other Ambulatory Visit: Payer: Self-pay | Admitting: Neurosurgery

## 2012-03-23 DIAGNOSIS — M545 Low back pain, unspecified: Secondary | ICD-10-CM

## 2012-04-02 ENCOUNTER — Ambulatory Visit (HOSPITAL_COMMUNITY)
Admission: RE | Admit: 2012-04-02 | Discharge: 2012-04-02 | Disposition: A | Discharge status: Home / Self Care | Payer: Medicare PPO | Source: Ambulatory Visit | Attending: Neurosurgery | Admitting: Neurosurgery

## 2012-04-02 DIAGNOSIS — M545 Low back pain, unspecified: Secondary | ICD-10-CM

## 2012-04-02 DIAGNOSIS — T84498A Other mechanical complication of other internal orthopedic devices, implants and grafts, initial encounter: Secondary | ICD-10-CM | POA: Insufficient documentation

## 2012-04-02 DIAGNOSIS — M48061 Spinal stenosis, lumbar region without neurogenic claudication: Secondary | ICD-10-CM | POA: Insufficient documentation

## 2012-04-02 DIAGNOSIS — M79609 Pain in unspecified limb: Secondary | ICD-10-CM | POA: Insufficient documentation

## 2012-04-02 DIAGNOSIS — M47817 Spondylosis without myelopathy or radiculopathy, lumbosacral region: Secondary | ICD-10-CM | POA: Insufficient documentation

## 2012-04-02 DIAGNOSIS — Z981 Arthrodesis status: Secondary | ICD-10-CM | POA: Insufficient documentation

## 2012-04-02 DIAGNOSIS — Y831 Surgical operation with implant of artificial internal device as the cause of abnormal reaction of the patient, or of later complication, without mention of misadventure at the time of the procedure: Secondary | ICD-10-CM | POA: Insufficient documentation

## 2012-04-02 MED ORDER — MORPHINE SULFATE 4 MG/ML IJ SOLN
INTRAMUSCULAR | Status: AC
  Filled 2012-04-02: qty 2

## 2012-04-02 MED ORDER — IOHEXOL 180 MG/ML  SOLN
20.0000 mL | Freq: Once | INTRAMUSCULAR | Status: AC | PRN
  Administered 2012-04-02: 20 mL via INTRATHECAL

## 2012-04-02 MED ORDER — ONDANSETRON HCL 4 MG/2ML IJ SOLN: 4.0000 mg | Freq: Four times a day (QID) | INTRAMUSCULAR | Status: DC | PRN

## 2012-04-02 MED ORDER — OXYCODONE-ACETAMINOPHEN 5-325 MG PO TABS: 1.0000 | ORAL_TABLET | ORAL | Status: DC | PRN

## 2012-04-02 MED ORDER — MORPHINE SULFATE 4 MG/ML IJ SOLN
4.0000 mg | INTRAMUSCULAR | Status: DC | PRN
  Administered 2012-04-02: 4 mg via INTRAMUSCULAR

## 2012-04-02 MED ORDER — DIAZEPAM 5 MG PO TABS
10.0000 mg | ORAL_TABLET | Freq: Once | ORAL | Status: AC
  Administered 2012-04-02: 10 mg via ORAL

## 2012-04-02 MED ORDER — DIAZEPAM 5 MG PO TABS
ORAL_TABLET | ORAL | Status: AC
  Administered 2012-04-02: 10 mg via ORAL
  Filled 2012-04-02: qty 2

## 2012-04-02 NOTE — Discharge Instructions (Signed)
Myelography Care After These instructions give you information on caring for yourself after your procedure. Your doctor may also give you specific instructions. Call your doctor if you have any problems or questions after your procedure. HOME CARE  Lie down for 24 hours. Lie in any position with 1 pillow under your head.   For 24 hours, get up only to eat or use the bathroom. Take only 10 minutes to eat.   For 24 hours, drink enough fluids to keep your pee (urine) clear or pale yellow. No alcohol.   Take all medicine as told by your doctor.   Avoid heavy lifting and activity for 48 hours.   You may take the bandage off the day after your myelography.   Do not take a bath for 24 hours. Ask your doctor if it is okay to take a shower.  Finding out the results of your test Ask your doctor when your test results will be ready. Make sure you follow up and get the test results. GET HELP RIGHT AWAY IF:   Any of the places where the needles were put in:   Are puffy (swollen) or red.   Are sore or hot to the touch.   Are draining yellowish-white fluid (pus).   Are bleeding after 10 minutes of pressing down on the site. Have someone press on any place that is bleeding until seen by a doctor.   You have a lasting headache that is not helped by medicine.   You have a bad headache with a stiff neck or fever.   You have trouble breathing.   You feel sick to your stomach (nauseous) or throw up (vomit).   You have pain or cramping in your belly (abdomen).   You have a fever.  If you go to the emergency room, tell the doctor you had a myelogram. Take this paper with you to show the doctor. MAKE SURE YOU:  Understand these instructions.   Will watch your condition.   Will get help right away if you are not doing well or get worse.  Document Released: 07/29/2008 Document Revised: 10/09/2011 Document Reviewed: 07/29/2008 [employer] Patient Information 2012 [employer]. 

## 2012-04-05 LAB — GLUCOSE, CAPILLARY: Glucose-Capillary: 155 mg/dL — ABNORMAL HIGH (ref 70–99)

## 2012-04-09 ENCOUNTER — Other Ambulatory Visit: Payer: Self-pay

## 2012-04-09 MED ORDER — OXYCODONE-ACETAMINOPHEN 7.5-500 MG PO TABS: 1.0000 | ORAL_TABLET | Freq: Three times a day (TID) | ORAL | Status: DC

## 2012-04-21 ENCOUNTER — Encounter: Payer: Self-pay | Admitting: Family Medicine

## 2012-04-21 ENCOUNTER — Ambulatory Visit (INDEPENDENT_AMBULATORY_CARE_PROVIDER_SITE_OTHER): Payer: Medicare PPO | Admitting: Family Medicine

## 2012-04-21 VITALS — BP 140/86 | HR 86 | Resp 16 | Ht 71.0 in | Wt 235.0 lb

## 2012-04-21 DIAGNOSIS — E785 Hyperlipidemia, unspecified: Secondary | ICD-10-CM

## 2012-04-21 DIAGNOSIS — G4700 Insomnia, unspecified: Secondary | ICD-10-CM

## 2012-04-21 DIAGNOSIS — E119 Type 2 diabetes mellitus without complications: Secondary | ICD-10-CM

## 2012-04-21 DIAGNOSIS — M549 Dorsalgia, unspecified: Secondary | ICD-10-CM

## 2012-04-21 DIAGNOSIS — F3289 Other specified depressive episodes: Secondary | ICD-10-CM

## 2012-04-21 DIAGNOSIS — F329 Major depressive disorder, single episode, unspecified: Secondary | ICD-10-CM

## 2012-04-21 DIAGNOSIS — F32A Depression, unspecified: Secondary | ICD-10-CM | POA: Insufficient documentation

## 2012-04-21 DIAGNOSIS — I10 Essential (primary) hypertension: Secondary | ICD-10-CM

## 2012-04-21 MED ORDER — BUPROPION HCL ER (SR) 150 MG PO TB12: 150.0000 mg | ORAL_TABLET | Freq: Every day | ORAL | Status: DC

## 2012-04-21 MED ORDER — TEMAZEPAM 15 MG PO CAPS: 15.0000 mg | ORAL_CAPSULE | Freq: Every evening | ORAL | Status: DC | PRN

## 2012-04-21 MED ORDER — OXYCODONE-ACETAMINOPHEN 10-325 MG PO TABS: ORAL_TABLET | ORAL | Status: DC

## 2012-04-21 MED ORDER — HYDROCHLOROTHIAZIDE 25 MG PO TABS: 25.0000 mg | ORAL_TABLET | Freq: Every day | ORAL | Status: DC

## 2012-04-21 NOTE — Patient Instructions (Addendum)
F/u in 7 weeks.  Since your recent urine screen was negative, it is vital that you take pain med as prescribed daily. Rept failure will result in the need for you to be treated at a pain clinic.  I am concerned about the worsening problems in your back and hope that a neurosurgeon can be identified soon.  Dose increase in strength of pain medication.  New medications for depression and sleep.  You need to quit smoking for improved health.  Additional medication hCTZ for blood pressure.  You will get information on sleep hygiene.  I hope you feel better

## 2012-05-14 ENCOUNTER — Ambulatory Visit: Payer: Medicare PPO | Admitting: Family Medicine

## 2012-05-16 ENCOUNTER — Telehealth: Payer: Self-pay | Admitting: Family Medicine

## 2012-05-16 DIAGNOSIS — Z01818 Encounter for other preprocedural examination: Secondary | ICD-10-CM

## 2012-05-16 NOTE — Progress Notes (Signed)
  Subjective:    Patient ID: Derrick Hinton, male    DOB: 1961-01-23, 51 y.o.   MRN: [id number]  HPI The PT is here for follow up and re-evaluation of chronic medical conditions, medication management and review of any available recent lab and radiology data. Recently had a negative urine drug screen though he reports taking pain meds as prescribed Preventive health is updated, specifically  Cancer screening and Immunization.   Questions or concerns regarding consultations or procedures which the PT has had in the interim are  addressed. The PT denies any adverse reactions to current medications since the last visit.  C/o increased and uncontrolled back pain, worsening , disturbing his sleep, and causing him to feel depressed. He is not suicidal or homicidal. States blood sugars are still too high, however he is working with endo o improve this     Review of Systems See HPI Denies recent fever or chills. Denies sinus pressure, nasal congestion, ear pain or sore throat. Denies chest congestion, productive cough or wheezing. Denies chest pains, palpitations and leg swelling Denies abdominal pain, nausea, vomiting,diarrhea or constipation.   Denies dysuria, frequency, hesitancy or incontinence. Denies headaches, seizures, numbness, or tingling. Denies skin break down or rash.        Objective:   Physical Exam Patient alert and oriented and in no cardiopulmonary distress.  HEENT: No facial asymmetry, EOMI, no sinus tenderness,  oropharynx pink and moist.  Neck supple no adenopathy.  Chest: Clear to auscultation bilaterally.Decreased though adequate air entry  CVS: S1, S2 no murmurs, no S3.  ABD: Soft non tender. Bowel sounds normal.  Ext: No edema  WU:[id number] ROM spine  Skin: Intact, no ulcerations or rash noted.  Psych: Good eye contact, normal affect. Memory intact, mildly  depressed appearing.  CNS: CN 2-12 intact, power, normal throughout.Decreased sensation in  feet        Assessment & Plan:

## 2012-05-16 NOTE — Assessment & Plan Note (Signed)
Uncontrolled, dose increase in pain med and neurosurg re eval

## 2012-05-16 NOTE — Assessment & Plan Note (Signed)
Updated lab to be obtained from endo who is now treating him for uncontrolled diabetes.  Low fat diet discussed and encouraged

## 2012-05-16 NOTE — Assessment & Plan Note (Addendum)
Un Controlled,  Additional  Medication added

## 2012-05-16 NOTE — Telephone Encounter (Signed)
Please refer pt to [facility] for cardiac clearance for back surgery and let him know this is necessary, I am entering the referral

## 2012-05-16 NOTE — Assessment & Plan Note (Signed)
Uncontrolled, sleep hygiene discussed and additional med added

## 2012-05-16 NOTE — Assessment & Plan Note (Signed)
Worsening due to increased debility and pain, start wellbutrin

## 2012-05-16 NOTE — Assessment & Plan Note (Signed)
Uncontrlled, now followed by endo

## 2012-05-18 ENCOUNTER — Other Ambulatory Visit: Payer: Self-pay

## 2012-05-18 DIAGNOSIS — M549 Dorsalgia, unspecified: Secondary | ICD-10-CM

## 2012-05-18 MED ORDER — OXYCODONE-ACETAMINOPHEN 10-325 MG PO TABS: ORAL_TABLET | ORAL | Status: DC

## 2012-05-24 ENCOUNTER — Other Ambulatory Visit: Payer: Self-pay

## 2012-05-24 ENCOUNTER — Telehealth: Payer: Self-pay | Admitting: Family Medicine

## 2012-05-24 MED ORDER — AMLODIPINE BESYLATE-VALSARTAN 10-320 MG PO TABS: 1.0000 | ORAL_TABLET | Freq: Every day | ORAL | Status: DC

## 2012-05-24 NOTE — Telephone Encounter (Signed)
Sent!

## 2012-06-03 ENCOUNTER — Ambulatory Visit (INDEPENDENT_AMBULATORY_CARE_PROVIDER_SITE_OTHER): Payer: Medicare PPO | Admitting: Cardiology

## 2012-06-03 ENCOUNTER — Encounter: Payer: Self-pay | Admitting: Cardiology

## 2012-06-03 VITALS — BP 120/80 | HR 69 | Ht 69.0 in | Wt 239.0 lb

## 2012-06-03 DIAGNOSIS — Z01818 Encounter for other preprocedural examination: Secondary | ICD-10-CM

## 2012-06-03 DIAGNOSIS — J309 Allergic rhinitis, unspecified: Secondary | ICD-10-CM

## 2012-06-03 DIAGNOSIS — F17200 Nicotine dependence, unspecified, uncomplicated: Secondary | ICD-10-CM

## 2012-06-03 DIAGNOSIS — Z720 Tobacco use: Secondary | ICD-10-CM

## 2012-06-03 DIAGNOSIS — I10 Essential (primary) hypertension: Secondary | ICD-10-CM

## 2012-06-03 DIAGNOSIS — M47816 Spondylosis without myelopathy or radiculopathy, lumbar region: Secondary | ICD-10-CM

## 2012-06-03 DIAGNOSIS — N529 Male erectile dysfunction, unspecified: Secondary | ICD-10-CM

## 2012-06-03 DIAGNOSIS — E119 Type 2 diabetes mellitus without complications: Secondary | ICD-10-CM

## 2012-06-03 DIAGNOSIS — E785 Hyperlipidemia, unspecified: Secondary | ICD-10-CM

## 2012-06-03 DIAGNOSIS — M47817 Spondylosis without myelopathy or radiculopathy, lumbosacral region: Secondary | ICD-10-CM

## 2012-06-03 DIAGNOSIS — J449 Chronic obstructive pulmonary disease, unspecified: Secondary | ICD-10-CM

## 2012-06-03 NOTE — Assessment & Plan Note (Signed)
Patient has not smoked a cigarette for 2 weeks, and does not appear to be suffering any significant withdrawal symptoms.  His opportunity to use nicotine replacement therapy over the counter was discussed with him.  He is congratulated on his efforts so far and asked to maintain his commitment to no longer use tobacco.

## 2012-06-03 NOTE — Assessment & Plan Note (Signed)
Blood pressure is excellent today and has been generally good when checked on multiple occasions over the past year.  Current medical therapy appears appropriate.  Mr. Derrick Hinton is receiving excellent treatment for management of cardiovascular risk factors and does not appear to need routine followup in my office.  I advised him of symptoms that would require a return visit.

## 2012-06-03 NOTE — Assessment & Plan Note (Addendum)
Proposed surgical procedure is low to moderate risk.  Patient has a benign cardiac history, no current symptoms, a negative exam and an essentially unremarkable EKG.  Preoperative testing has not been shown to have a role in this situation.  Due to multiple cardiovascular risk factors, possibility of a perioperative cardiac complication is somewhat increased, but not excessively so.  [facility] will be available to assist in hospital as needed.

## 2012-06-03 NOTE — Progress Notes (Signed)
Patient ID: Derrick Hinton, male   DOB: 05-Apr-1961, 51 y.o.   MRN: [id number]  HPI: 51 year old gentleman for initial cardiology evaluation performed at the kind request of Dr. Lodema Hong prior to planned lumbosacral spine surgery.  This nice gentleman has no history of cardiovascular disease, but has multiple risk factors including tobacco use, hypertension, hyperlipidemia and diabetes.  A pharmacologic stress test performed 5 years ago was negative, and LV systolic function was normal.  He has had no subsequent cardiac symptoms and underwent uncomplicated spine surgery approximately 4 years ago.  He now requires further surgical treatment.  Denies chronic cough or sputum production.  He has not experienced multiple episodes of bronchitis nor required hospitalization for pulmonary issues.  He has had wheezing in the past, but this has resolved since he discontinued cigarette smoking 2 weeks ago.  Current Outpatient Prescriptions on File Prior to Visit  Medication Sig Dispense Refill  . amLODipine-valsartan (EXFORGE) 10-320 MG per tablet Take 1 tablet by mouth daily.  90 tablet  1  . Apple Cider Vinegar 188 MG CAPS Take 6 capsules by mouth daily.      Marland Kitchen aspirin (ASPIRIN LOW DOSE) 81 MG EC tablet Take 81 mg by mouth daily.        . baclofen (LIORESAL) 10 MG tablet Take 10 mg by mouth 2 (two) times daily.      . benazepril (LOTENSIN) 40 MG tablet Take 40 mg by mouth daily.      Marland Kitchen buPROPion (WELLBUTRIN SR) 150 MG 12 hr tablet Take 1 tablet (150 mg total) by mouth daily.  30 tablet  5  . cloNIDine (CATAPRES) 0.3 MG tablet Take 0.45-0.6 mg by mouth 2 (two) times daily. 1 and 1/2 tablet every morning and 2 tablets every evening      . ergocalciferol (VITAMIN D2) 50000 UNITS capsule Take 50,000 Units by mouth once a week.      . gabapentin (NEURONTIN) 300 MG capsule Take 300 mg by mouth 4 (four) times daily.      Marland Kitchen glucose blood test strip 1 each 3 (three) times daily. Use as instructed       .  hydrochlorothiazide (HYDRODIURIL) 25 MG tablet Take 1 tablet (25 mg total) by mouth daily.  30 tablet  5  . insulin detemir (LEVEMIR) 100 UNIT/ML injection Inject 75 Units into the skin at bedtime.       Marland Kitchen oxyCODONE-acetaminophen (PERCOCET) 10-325 MG per tablet One tablet every 8 hours daily for pain  Dose increase effective 04/21/2012  90 tablet  0  . rosuvastatin (CRESTOR) 20 MG tablet Take 20 mg by mouth every Monday, Wednesday, and Friday.      . sitaGLIPtan-metformin (JANUMET) 50-1000 MG per tablet Take 1 tablet by mouth 2 (two) times daily with a meal.      . vitamin C (ASCORBIC ACID) 500 MG tablet Take 1,000 mg by mouth daily.        . rosuvastatin (CRESTOR) 20 MG tablet Take one tab every mon wed and fri  90 tablet  1  . temazepam (RESTORIL) 15 MG capsule Take 1 capsule (15 mg total) by mouth at bedtime as needed for sleep.  30 capsule  2   Allergies  Allergen Reactions  . Codeine Itching    Past Medical History  Diagnosis Date  . Allergic rhinitis   . Depression   . Hyperlipidemia   . Obesity   . Hypertension   . Diabetes mellitus, type 1   . Back pain  Past Surgical History  Procedure Date  . Cyst removed from left lung 1995  . Appendectomy 1975  . Back surgery 2008  . Right arm surgery to remove bursitis pocket and bone spur july 2011    Family History  Problem Relation Age of Onset  . Hypertension Mother   . Hypertension Father   . Hypertension Brother     History   Social History  . Marital Status: Married    Spouse Name: N/A    Number of Children: 4  . Years of Education: N/A   Occupational History  . disabled     Social History Main Topics  . Smoking status: Current Everyday Smoker -- 0.5 packs/day    Types: Cigarettes  . Smokeless tobacco: Not on file  . Alcohol Use: No  . Drug Use: No  . Sexually Active: Not on file   Other Topics Concern  . Not on file   Social History Narrative   Pt has for children there youngest is 19 months in  Oct.2011[facility] fax [phone]    ROS:  Exercise is limited due to chronic low back pain.   All other systems reviewed and are negative.  PHYSICAL EXAM: BP 120/80  Pulse 69  Ht 5\' 9"  (1.753 m)  Wt 108.41 kg (239 lb)  BMI 35.29 kg/m2  SpO2 99%  General-Well-developed; no acute distress Body [place]-Moderately overweight HEENT-[place]/AT; PERRL; EOM intact; conjunctiva and lids nl Neck-No JVD; no carotid bruits Endocrine-No thyromegaly Lungs-Clear lung fields; resonant percussion; normal I-to-E ratio Cardiovascular- normal PMI; normal S1 and S2 Abdomen-BS normal; soft and non-tender without masses or organomegaly Musculoskeletal-No deformities, cyanosis or clubbing Neurologic-Nl cranial nerves; symmetric strength and tone Skin- Warm, no significant lesions Extremities-Nl distal pulses; no edema  EKG:  Normal sinus rhythm; first degree AV block; prominent QRS voltage without criteria for LVH; delayed R-wave progression.   ASSESSMENT AND PLAN:  Washburn Bing, MD 06/03/2012 12:28 PM

## 2012-06-03 NOTE — Assessment & Plan Note (Signed)
Excellent control of hyperlipidemia when last assessed one year ago.

## 2012-06-03 NOTE — Progress Notes (Deleted)
Name: Derrick Hinton    DOB: 03/17/1961  Age: 51 y.o.  MR#: [id number]       PCP:  Syliva Overman, MD      Insurance: @PAYORNAME @   CC:    Chief Complaint  Patient presents with  . Hypertension    Here for surgical clearance Dr Fayrene Fearing Nitka/ Med list/TC  . Back Pain    VS BP 120/80  Pulse 69  Ht 5\' 9"  (1.753 m)  Wt 239 lb (108.41 kg)  BMI 35.29 kg/m2  SpO2 99%  Weights Current Weight  06/03/12 239 lb (108.41 kg)  04/21/12 235 lb (106.595 kg)  04/02/12 240 lb (108.863 kg)    Blood Pressure  BP Readings from Last 3 Encounters:  06/03/12 120/80  04/21/12 140/86  04/02/12 146/78     Admit date:  (Not on file) Last encounter with RMR:  Visit date not found   Allergy Allergies  Allergen Reactions  . Codeine Itching    Current Outpatient Prescriptions  Medication Sig Dispense Refill  . amLODipine-valsartan (EXFORGE) 10-320 MG per tablet Take 1 tablet by mouth daily.  90 tablet  1  . Apple Cider Vinegar 188 MG CAPS Take 6 capsules by mouth daily.      Marland Kitchen aspirin (ASPIRIN LOW DOSE) 81 MG EC tablet Take 81 mg by mouth daily.        . B-D ULTRAFINE III SHORT PEN 31G X 8 MM MISC       . baclofen (LIORESAL) 10 MG tablet Take 10 mg by mouth 2 (two) times daily.      . benazepril (LOTENSIN) 40 MG tablet Take 40 mg by mouth daily.      Marland Kitchen buPROPion (WELLBUTRIN SR) 150 MG 12 hr tablet Take 1 tablet (150 mg total) by mouth daily.  30 tablet  5  . cloNIDine (CATAPRES) 0.3 MG tablet Take 0.45-0.6 mg by mouth 2 (two) times daily. 1 and 1/2 tablet every morning and 2 tablets every evening      . ergocalciferol (VITAMIN D2) 50000 UNITS capsule Take 50,000 Units by mouth once a week.      . gabapentin (NEURONTIN) 300 MG capsule Take 300 mg by mouth 4 (four) times daily.      Marland Kitchen glucose blood test strip 1 each 3 (three) times daily. Use as instructed       . HUMALOG KWIKPEN 100 UNIT/ML injection       . hydrochlorothiazide (HYDRODIURIL) 25 MG tablet Take 1 tablet (25 mg total) by mouth  daily.  30 tablet  5  . insulin detemir (LEVEMIR) 100 UNIT/ML injection Inject 75 Units into the skin at bedtime.       Marland Kitchen oxyCODONE-acetaminophen (PERCOCET) 10-325 MG per tablet One tablet every 8 hours daily for pain  Dose increase effective 04/21/2012  90 tablet  0  . rosuvastatin (CRESTOR) 20 MG tablet Take 20 mg by mouth every Monday, Wednesday, and Friday.      . sitaGLIPtan-metformin (JANUMET) 50-1000 MG per tablet Take 1 tablet by mouth 2 (two) times daily with a meal.      . vitamin C (ASCORBIC ACID) 500 MG tablet Take 1,000 mg by mouth daily.        . rosuvastatin (CRESTOR) 20 MG tablet Take one tab every mon wed and fri  90 tablet  1  . temazepam (RESTORIL) 15 MG capsule Take 1 capsule (15 mg total) by mouth at bedtime as needed for sleep.  30 capsule  2  Discontinued Meds:   There are no discontinued medications.  Patient Active Problem List  Diagnosis  . DIABETES MELLITUS, TYPE II  . HYPERLIPIDEMIA  . OBESITY  . HYPERTENSION  . ALLERGIC RHINITIS  . ERECTILE DYSFUNCTION, ORGANIC  . BACK PAIN  . DISORDER OF BONE AND CARTILAGE UNSPECIFIED  . FATIGUE  . Sinusitis, chronic  . Headache  . COPD (chronic obstructive pulmonary disease)  . Bronchitis  . Insomnia  . Depression    [facility]S Hospital Outpatient Visit on 04/02/2012  Component Date Value  . Glucose-Capillary 04/02/2012 155*  Orders Only on 03/16/2012  Component Date Value  . Benzodiazepines. 03/16/2012 NEG   . Phencyclidine (PCP) 03/16/2012 NEG   . Cocaine Metabolites 03/16/2012 NEG   . Amphetamine Screen, Ur 03/16/2012 NEG   . Marijuana Metabolite 03/16/2012 NEG   . Opiates 03/16/2012 NEG   . Barbiturate Quant, Ur 03/16/2012 NEG   . Methadone 03/16/2012 NEG   . Propoxyphene 03/16/2012 NEG   . Creatinine,U 03/16/2012 146.43      Results for this Opt Visit:     Results for orders placed during the hospital encounter of 04/02/12  GLUCOSE, CAPILLARY      Component Value Range   Glucose-Capillary 155 (*)  70 - 99 mg/dL    EKG Orders placed in visit on 06/03/12  . EKG 12-LEAD     Prior Assessment and Plan Problem List as of 06/03/2012            Cardiology Problems   HYPERLIPIDEMIA   Last Assessment & Plan Note   04/21/2012 Office Visit Signed 05/16/2012 11:10 PM by Kerri Perches, MD    Updated lab to be obtained from endo who is now treating him for uncontrolled diabetes.  Low fat diet discussed and encouraged    HYPERTENSION   Last Assessment & Plan Note   04/21/2012 Office Visit Addendum 05/16/2012 11:17 PM by Kerri Perches, MD    Un Controlled,  Additional  Medication added       Other   DIABETES MELLITUS, TYPE II   Last Assessment & Plan Note   04/21/2012 Office Visit Signed 05/16/2012 11:11 PM by Kerri Perches, MD    Uncontrlled, now followed by endo    OBESITY   Last Assessment & Plan Note   12/15/2011 Office Visit Signed 12/15/2011 12:19 PM by Kerri Perches, MD    Improved. Pt applauded on succesful weight loss through lifestyle change, and encouraged to continue same. Weight loss goal set for the next several months.     ALLERGIC RHINITIS   Last Assessment & Plan Note   05/01/2011 Office Visit Signed 05/04/2011  2:38 PM by Syliva Overman, MD    Uncontrolled and a source of headaches, trigger avoidance stressed    ERECTILE DYSFUNCTION, ORGANIC   BACK PAIN   Last Assessment & Plan Note   04/21/2012 Office Visit Signed 05/16/2012 11:11 PM by Kerri Perches, MD    Uncontrolled, dose increase in pain med and neurosurg re eval    DISORDER OF BONE AND CARTILAGE UNSPECIFIED   FATIGUE   Sinusitis, chronic   Headache   Last Assessment & Plan Note   05/01/2011 Office Visit Signed 05/04/2011  2:40 PM by Syliva Overman, MD    No focal deficits on exam, history consistent with uncontrolled allergies as the trigger, pt advised to go to ed if symptoms worsen, or new ones develop    COPD (chronic obstructive pulmonary disease)   Bronchitis   Last  Assessment & Plan Note   08/12/2011 Office Visit Signed 08/13/2011  8:48 AM by Kerri Perches, MD    Antibiotics and decongestants prescribed    Insomnia   Last Assessment & Plan Note   04/21/2012 Office Visit Signed 05/16/2012 11:09 PM by Kerri Perches, MD    Uncontrolled, sleep hygiene discussed and additional med added    Depression   Last Assessment & Plan Note   04/21/2012 Office Visit Signed 05/16/2012 11:12 PM by Kerri Perches, MD    Worsening due to increased debility and pain, start wellbutrin        Imaging: No results found.   FRS Calculation: Score not calculated. Missing: Total Cholesterol

## 2012-06-03 NOTE — Assessment & Plan Note (Signed)
Neither history nor exam indicates physiologically important lung disease, but possibility of pulmonary complications is increased due to long-term and recent tobacco use.  [facility] will be available as needed should lung problems developed perioperatively.

## 2012-06-03 NOTE — Patient Instructions (Signed)
Your physician recommends that you schedule a follow-up appointment in: as needed  

## 2012-06-03 NOTE — Assessment & Plan Note (Signed)
Diabetic control has been somewhat suboptimal with hemoglobin A1c levels around 8.  More intensive therapy would likely provide benefit.

## 2012-06-09 ENCOUNTER — Telehealth: Payer: Self-pay

## 2012-06-09 ENCOUNTER — Encounter: Payer: Self-pay | Admitting: Family Medicine

## 2012-06-09 ENCOUNTER — Ambulatory Visit (INDEPENDENT_AMBULATORY_CARE_PROVIDER_SITE_OTHER): Payer: Medicare PPO | Admitting: Family Medicine

## 2012-06-09 ENCOUNTER — Ambulatory Visit (HOSPITAL_COMMUNITY)
Admission: RE | Admit: 2012-06-09 | Discharge: 2012-06-09 | Disposition: A | Discharge status: Home / Self Care | Payer: Medicare PPO | Source: Ambulatory Visit | Attending: Family Medicine | Admitting: Family Medicine

## 2012-06-09 VITALS — BP 128/76 | HR 74 | Resp 18 | Ht 71.0 in | Wt 238.1 lb

## 2012-06-09 DIAGNOSIS — E669 Obesity, unspecified: Secondary | ICD-10-CM

## 2012-06-09 DIAGNOSIS — E119 Type 2 diabetes mellitus without complications: Secondary | ICD-10-CM

## 2012-06-09 DIAGNOSIS — I10 Essential (primary) hypertension: Secondary | ICD-10-CM

## 2012-06-09 DIAGNOSIS — M47816 Spondylosis without myelopathy or radiculopathy, lumbar region: Secondary | ICD-10-CM

## 2012-06-09 DIAGNOSIS — M47817 Spondylosis without myelopathy or radiculopathy, lumbosacral region: Secondary | ICD-10-CM

## 2012-06-09 DIAGNOSIS — Z125 Encounter for screening for malignant neoplasm of prostate: Secondary | ICD-10-CM

## 2012-06-09 DIAGNOSIS — Z01818 Encounter for other preprocedural examination: Secondary | ICD-10-CM | POA: Insufficient documentation

## 2012-06-09 DIAGNOSIS — E785 Hyperlipidemia, unspecified: Secondary | ICD-10-CM

## 2012-06-09 LAB — TSH: TSH: 1.638 u[IU]/mL (ref 0.350–4.500)

## 2012-06-09 LAB — COMPLETE METABOLIC PANEL WITH GFR
Albumin: 4.5 g/dL (ref 3.5–5.2)
BUN: 13 mg/dL (ref 6–23)
CO2: 29 mEq/L (ref 19–32)
Calcium: 9.8 mg/dL (ref 8.4–10.5)
GFR, Est African American: >89 mL/min
GFR, Est Non African American: >89 mL/min
Glucose, Bld: 186 mg/dL — ABNORMAL HIGH (ref 70–99)
Potassium: 5 mEq/L (ref 3.5–5.3)
Sodium: 138 mEq/L (ref 135–145)
Total Protein: 7.5 g/dL (ref 6.0–8.3)

## 2012-06-09 LAB — LIPID PANEL
Cholesterol: 116 mg/dL (ref 0–200)
Total CHOL/HDL Ratio: 3.6 Ratio

## 2012-06-09 MED ORDER — HYDROCHLOROTHIAZIDE 25 MG PO TABS: 25.0000 mg | ORAL_TABLET | Freq: Every day | ORAL | Status: DC

## 2012-06-09 MED ORDER — ERGOCALCIFEROL 1.25 MG (50000 UT) PO CAPS: 50000.0000 [IU] | ORAL_CAPSULE | ORAL | Status: DC

## 2012-06-09 MED ORDER — OXYCODONE HCL 20 MG PO TB12: 20.0000 mg | ORAL_TABLET | Freq: Two times a day (BID) | ORAL | Status: DC

## 2012-06-09 MED ORDER — SITAGLIPTIN PHOS-METFORMIN HCL 50-1000 MG PO TABS: 1.0000 | ORAL_TABLET | Freq: Two times a day (BID) | ORAL | Status: DC

## 2012-06-09 MED ORDER — BACLOFEN 10 MG PO TABS: 10.0000 mg | ORAL_TABLET | Freq: Two times a day (BID) | ORAL | Status: DC

## 2012-06-09 MED ORDER — OXYCODONE HCL 5 MG PO TABS: ORAL_TABLET | ORAL | Status: DC

## 2012-06-09 NOTE — Telephone Encounter (Signed)
Said that the Oxy IR was too expensive. Pharmacy said that morphine was covered. He is coming back tomorrow to bring the other rx. Please advise

## 2012-06-09 NOTE — Patient Instructions (Addendum)
F/u in 4 weeks.  You are medically cleared for surgery, we will send a note to your Doctor, pls give [place] the contact info.  All the best with your surgery  cXR today.  Lipid, cmp and EgFR, TSH and pSA and vit D level today.  Change in pain medication as discussed  Congrats on smoking cessation, stay quit!  Work on weight loss and gradual increase in physical activity to help with weight loss, and improved energy

## 2012-06-09 NOTE — Telephone Encounter (Signed)
Tell him just use the oxycontin only, forget the oxyir , I do not intend to prescribe the morphine

## 2012-06-09 NOTE — Telephone Encounter (Signed)
Pt aware.

## 2012-06-13 NOTE — Assessment & Plan Note (Addendum)
Controlled, no change in medication DASH diet and commitment to daily physical activity for a minimum of 30 minutes discussed and encouraged, as a part of hypertension management. The importance of attaining a healthy weight is also discussed.  

## 2012-06-13 NOTE — Assessment & Plan Note (Signed)
Deteriorated. Patient re-educated about  the importance of commitment to a  minimum of 150 minutes of exercise per week. The importance of healthy food choices with portion control discussed. Encouraged to start a food diary, count calories and to consider  joining a support group. Sample diet sheets offered. Goals set by the patient for the next several months.    

## 2012-06-13 NOTE — Progress Notes (Signed)
  Subjective:    Patient ID: Derrick Hinton, male    DOB: 1960/12/02, 51 y.o.   MRN: [id number]  HPI The PT is here for follow up and re-evaluation of chronic medical conditions, medication management and review of any available recent lab and radiology data.  Preventive health is updated, specifically  Cancer screening and Immunization.   Questions or concerns regarding consultations or procedures which the PT has had in the interim are  Addressed.Has been cleared by cardiology for upcoming surgery on back The PT denies any adverse reactions to current medications since the last visit.  There are no new concerns.  There are no specific complaints       Review of Systems See HPI Denies recent fever or chills. Denies sinus pressure, nasal congestion, ear pain or sore throat. Denies chest congestion, productive cough or wheezing. Denies chest pains, palpitations and leg swelling Denies abdominal pain, nausea, vomiting,diarrhea or constipation.   Denies dysuria, frequency, hesitancy or incontinence. Chronic  joint pain,  and limitation in mobility. Denies headaches, seizures, numbness, or tingling. Denies depression, anxiety or insomnia. Denies skin break down or rash.        Objective:   Physical Exam Patient alert and oriented and in no cardiopulmonary distress.  HEENT: No facial asymmetry, EOMI, no sinus tenderness,  oropharynx pink and moist.  Neck supple no adenopathy.  Chest: Clear to auscultation bilaterally.decreased throughout  CVS: S1, S2 no murmurs, no S3.  ABD: Soft non tender. Bowel sounds normal.  Ext: No edema  MS: decreased ROM spine, shoulders, hips and knees.  Skin: Intact, no ulcerations or rash noted.  Psych: Good eye contact, normal affect. Memory intact not anxious or depressed appearing.  CNS: CN 2-12 intact, power, tone and sensation normal throughout.        Assessment & Plan:

## 2012-06-13 NOTE — Assessment & Plan Note (Signed)
Triglycerides elevated, dietary change, continue current medication

## 2012-06-13 NOTE — Assessment & Plan Note (Signed)
Improved, but still uncontrolled, followed by endo

## 2012-06-13 NOTE — Assessment & Plan Note (Signed)
Change in pain medication at this visit, to eliminate tylenol componenent

## 2012-06-14 ENCOUNTER — Encounter (HOSPITAL_COMMUNITY): Payer: Self-pay

## 2012-06-15 ENCOUNTER — Telehealth: Payer: Self-pay | Admitting: Family Medicine

## 2012-06-15 NOTE — Telephone Encounter (Signed)
pls let pt know io am only prescribing long acting oxycontin which he has already picked, up,if states he is in too much pain with that I will write for percoctr, which he has been on , but just twice daily, was trying to cut out the tylenol, and I am not prescribing the methadone as already explained

## 2012-06-16 ENCOUNTER — Other Ambulatory Visit: Payer: Self-pay | Admitting: Family Medicine

## 2012-06-16 ENCOUNTER — Other Ambulatory Visit: Payer: Self-pay

## 2012-06-16 MED ORDER — METHADONE HCL 10 MG PO TABS: 10.0000 mg | ORAL_TABLET | Freq: Two times a day (BID) | ORAL | Status: AC

## 2012-06-16 NOTE — Telephone Encounter (Signed)
Spoke with pharmacy and pt has filled oxy ir and the OxyContin is not covered by his insurance.

## 2012-06-16 NOTE — Telephone Encounter (Signed)
pls VERIFY what pain medication pt has from his pharmacy.  The new message I have is that is it oxy iR, if this is correct, he keeps this and he can get the metahdone as the long acting agent .  Let me know if there is a problem please

## 2012-06-16 NOTE — Telephone Encounter (Signed)
Ok, I spoke with him because he is calling again for his narc. I told him the new med was the oxycodone 20. He said it was that one that the insurance wouldn't cover. (the 12 hr tab) He said they would cover the one for breakthrough pain but he is needing the one for his scheduled pain relief. What do I need to tell him? This is the 2nd time he has called back about this and he said he needs his pain meds

## 2012-06-16 NOTE — Telephone Encounter (Signed)
Script given for methadone

## 2012-06-17 NOTE — H&P (Signed)
Derrick Hinton is an 51 y.o. male.   Chief Complaint: severe back pain HPI: HISTORY OF PRESENT ILLNESS:  The patient is self-referred with Dr. Sueanne Margarita permission.  The patient is a 51 year old male, previous history of L4-S1 fusion by Dr. Coletta Memos in 2008.  Surgery done at that time using interbody cages and bone graft by [employer] and a pedicle screw and rod construct also perhaps done with [employer] instrumentation.  The patient now is experiencing worsening pain and discomfort into his back after a period of significant improvement in his overall pain pattern and activity level.  He relates that the pain in his back is into the transverse back at the level of his pelvis and radiates into his buttocks and then into the sides of his legs.  He has pain when he turns or twists and when he bends forward.  It is present when he stands and ambulates and it limits his walking.  He reports that he has trouble when he walks to the back of the [employer] store, trouble getting back to the front of the store.  He has pain with stair climbing.  Difficulty bending and reaching his shoes and socks.  Difficulty bathing his feet.  Has a history of diabetes.  No bowel or bladder symptoms.  Describes feelings of weakness in his legs as he stand and ambulates, a feeling of tiredness and cramping, soreness.  No particular numbness or paresthesias.  Sitting down improves the pain and bending forward and stooping improves the pain.  The pain on a scale of 1-10, he says his pain is about a 9 or a 10.  It has been severe over the last 12 months.  He has been seen by Dr. Franky Macho, had myelogram and post myelogram CT scan.  Dr. Franky Macho feels as though he needs to have something done but he does not take his insurance so he is referred for evaluation.  He has been receiving his medicines for discomfort through his primary care physician, Dr. Lodema Hong.  A note from Dr. Franky Macho indicates that the patient is having rather severe  degenerative changes at adjacent level, L3-4, above an L4 to sacrum fusion.   RADIOGRAPHS/TEST:  The patient's plain radiographs of the lumbar spine show the presence of pedicle screws and rods fixing the L4 to sacrum level.  The presence of interbody spacers and bone graft fixing the L4-5, L5-S1 levels.  The pedicles screws at the S1 level are fractured.  However, he does appear to have solid fusion across the L5-S1 level.  Some very mild residual anterolisthesis at both L4-5, L5-S1.  There is adjacent level degenerative disk disease at L3-4 with anterolisthesis of about 3-4 mm seen on lateral radiograph.  Flexion view shows that this degree of slip is very similar with perhaps 1 or 2 mm worsening with flexion.  Vacuum sign at the L3-4 level.  There is some loss of lordosis associated with the multilevel anterolisthesis occurring at L3-4 and L4-5, L5-S1.  Myelogram and post-myelogram CT scan of the patient reviewed today.  Performed 04/02/2012.  Study myelogram shows bilateral lateral recess narrowing and circumferential constriction of the thecal sac at the L3-4 level.  Presence of pedicle screws and rods fixing the L4 to sacrum levels.  Pedicles screws at S1, both are fractured.  S1 roots appear to fill poorly.  The CT scan post myelogram shows L3-4 with multifactorial spinal stenosis, disk space narrowed, circumferential constriction of thecal sac at this level.  There is no  stenosis noted L4 to sacrum or at L1-2 or L2-3.  The patient unable to tolerate standing or bending films.  Findings consistent with multifactorial spinal stenosis.  At the L3-4 level, there is also widening of the facet joints noted and suggesting anterolisthesis present.  On sagittal images of the lumbar spine, there is a vacuum sign associated with the L5-S1 interbody fusion site.  Bone bridging is not definitely present across this segment.  The L3-L4 level shows definite significant narrowing of the AP diameter of the canal associated  with anterior impression on the thecal sac as well as posterior impression consistent with probable disk herniation as well as stenosis related to hypertrophy at posterior elements and disk changes.   ASSESSMENT/DIAGNOSIS:  This patient has had a previous L4 to sacrum fusion and his immediate problems appear to be related to adjacent level degenerative changes and spinal stenosis.  He has failure of fixation at the S1 level, fractured screws at this level.  There is suggestion of vacuum sign at L5-S1.  He has significant limitation of function, ability to stand and ambulate, and this most likely will not improve with conservative management at this point  Past Medical History  Diagnosis Date  . Allergic rhinitis   . Hyperlipidemia   . Obesity   . Back pain   . Tobacco abuse     40 pack years; discontinued in 05/2012  . Hypertension     takes meds daily  . Depression     takes meds daily  . Diabetes mellitus, type 1     takes insulin daily - fasting sugar around 160s  . Chronic kidney disease     Sees Dr. Kristian Covey for kidney function  . Arthritis     Past Surgical History  Procedure Date  . Cyst removed from left lung 1995  . Appendectomy 1975  . Back surgery 2008  . Right arm surgery to remove bursitis pocket and bone spur july 2011    Family History  Problem Relation Age of Onset  . Hypertension Mother   . Hypertension Father   . Hypertension Brother    Social History:  reports that he quit smoking about 3 weeks ago. His smoking use included Cigarettes. He smoked .5 packs per day. He does not have any smokeless tobacco history on file. He reports that he does not drink alcohol or use illicit drugs.  Allergies:  Allergies  Allergen Reactions  . Codeine Itching  . Raspberry Concentrate (Flavoring Agent) Itching    No prescriptions prior to admission    No results found for this or any previous visit (from the past 48 hour(s)). No results found.  Review of Systems    Constitutional: Negative.   HENT: Negative.   Eyes: Negative.   Respiratory: Negative.   Cardiovascular: Negative.   Gastrointestinal: Negative.   Genitourinary: Negative.   Musculoskeletal: Positive for back pain.  Skin: Negative.   Neurological: Negative.   Endo/Heme/Allergies: Negative.   Psychiatric/Behavioral: Negative.     There were no vitals taken for this visit. Physical Exam  Constitutional: He is oriented to person, place, and time. He appears well-developed and well-nourished.  HENT:  Head: Normocephalic and atraumatic.  Eyes: EOM are normal. Pupils are equal, round, and reactive to light.  Neck: Normal range of motion. Neck supple.  Cardiovascular: Normal rate and regular rhythm.   Respiratory: Effort normal and breath sounds normal.  GI: Soft. Bowel sounds are normal.  Musculoskeletal:       PHYSICAL  EXAMINATION:  Shows his height is 5 feet, 9 inches.  Weight 235.  His gait/heel and toe walking is off balance with both toe and heel walking to a similar degree.  Forward bending is fingertips to about the mid-calf level.  Extension with discomfort.  Decreased lordosis, lumbar spine, noted. The reflexes at the knee are 1+ and symmetric; at the ankle, 1+ and symmetric.  Sciatic tension tests are unremarkable today.  Popliteal compression sign, dangling straight leg raise negative.  The incision, midline lumbar spine, is well healed.  Strength in his foot dorsiflexion, plantar flexion, knee extension/flexion, hip abduction/adduction, hip flexion strength all appear intact.    Neurological: He is alert and oriented to person, place, and time.  Skin: Skin is warm and dry.  Psychiatric: He has a normal mood and affect.     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  PLAN:  Decompression and transforaminal lumbar interbody fusion left L3-4, removal hardware L4-S1.  Redo instrumentation  with extension of fusion to include L3-S1.  Osteostem battery and bone marrow aspirate.  Talicia Sui M 06/28/2012, 3:26 PM

## 2012-06-22 NOTE — Pre-Procedure Instructions (Signed)
20 Derrick Hinton  06/22/2012   Your procedure is scheduled on:  Tues, Aug 27 @ 11:20 AM  Report to [facility] at 9:15 AM.  Call this number if you have problems the morning of surgery: [phone]   Remember:   Do not eat food:After Midnight.  Take these medicines the morning of surgery with A SIP OF WATER: Amlodipine-Valsartan(Exforge),Bupropion(Wellbutrin),Clonidine(Catapres), and a Pain Pill(if needed)   Do not wear jewelry  Do not wear lotions, powders, or colognes  Men may shave face and neck.  Do not bring valuables to the hospital.  Contacts, dentures or bridgework may not be worn into surgery.  Leave suitcase in the car. After surgery it may be brought to your room.  For patients admitted to the hospital, checkout time is 11:00 AM the day of discharge.   Patients discharged the day of surgery will not be allowed to drive home.    Special Instructions: CHG Shower Use Special Wash: 1/2 bottle night before surgery and 1/2 bottle morning of surgery.   Please read over the following fact sheets that you were given: Pain Booklet, Coughing and Deep Breathing, Blood Transfusion Information, MRSA Information and Surgical Site Infection Prevention

## 2012-06-23 ENCOUNTER — Encounter (HOSPITAL_COMMUNITY)
Admission: RE | Admit: 2012-06-23 | Discharge: 2012-06-23 | Disposition: A | Discharge status: Home / Self Care | Payer: Medicare PPO | Source: Ambulatory Visit | Attending: Specialist | Admitting: Specialist

## 2012-06-23 ENCOUNTER — Ambulatory Visit (HOSPITAL_COMMUNITY): Admission: RE | Admit: 2012-06-23 | Payer: Medicare PPO | Source: Ambulatory Visit

## 2012-06-23 ENCOUNTER — Encounter (HOSPITAL_COMMUNITY): Payer: Self-pay

## 2012-06-23 ENCOUNTER — Encounter (HOSPITAL_COMMUNITY): Admission: RE | Admit: 2012-06-23 | Payer: Medicare PPO | Source: Ambulatory Visit

## 2012-06-23 HISTORY — DX: Chronic kidney disease, unspecified: N18.9

## 2012-06-23 HISTORY — DX: Unspecified osteoarthritis, unspecified site: M19.90

## 2012-06-23 LAB — CBC WITH DIFFERENTIAL/PLATELET
Basophils Absolute: 0.1 10*3/uL (ref 0.0–0.1)
Basophils Relative: 1 % (ref 0–1)
Hemoglobin: 14.3 g/dL (ref 13.0–17.0)
Lymphocytes Relative: 25 % (ref 12–46)
MCHC: 32.9 g/dL (ref 30.0–36.0)
Monocytes Relative: 8 % (ref 3–12)
Neutro Abs: 7.2 10*3/uL (ref 1.7–7.7)
Neutrophils Relative %: 65 % (ref 43–77)
RDW: 13.2 % (ref 11.5–15.5)
WBC: 11 10*3/uL — ABNORMAL HIGH (ref 4.0–10.5)

## 2012-06-23 LAB — COMPREHENSIVE METABOLIC PANEL
AST: 16 U/L (ref 0–37)
Albumin: 3.7 g/dL (ref 3.5–5.2)
Alkaline Phosphatase: 84 U/L (ref 39–117)
CO2: 29 mEq/L (ref 19–32)
Chloride: 100 mEq/L (ref 96–112)
Potassium: 4.4 mEq/L (ref 3.5–5.1)
Total Bilirubin: 0.2 mg/dL — ABNORMAL LOW (ref 0.3–1.2)

## 2012-06-23 LAB — URINALYSIS, ROUTINE W REFLEX MICROSCOPIC
Leukocytes, UA: NEGATIVE
Nitrite: NEGATIVE
Specific Gravity, Urine: 1.021 (ref 1.005–1.030)
pH: 5.5 (ref 5.0–8.0)

## 2012-06-23 LAB — SURGICAL PCR SCREEN
MRSA, PCR: NEGATIVE
Staphylococcus aureus: POSITIVE — AB

## 2012-06-23 LAB — URINE MICROSCOPIC-ADD ON

## 2012-06-23 LAB — TYPE AND SCREEN

## 2012-06-23 NOTE — Progress Notes (Signed)
Dr. Lodema Hong - Primary Physician  Cardiologist - [facility] in [place]; Dr. Dietrich Pates. EKG in epic from 2013, remembers having stress test at [facility] unsure of year.

## 2012-06-24 ENCOUNTER — Telehealth: Payer: Self-pay

## 2012-06-24 NOTE — Telephone Encounter (Signed)
Pt aware.  And states the he will try increasing water and if he continues to have the symptoms will call back to schedule appt.

## 2012-06-24 NOTE — Telephone Encounter (Signed)
He can continue taking HCTZ, if he has UTI symptoms schedule for visit Otherwise increase water intake

## 2012-06-28 MED ORDER — DEXTROSE 5 % IV SOLN
3.0000 g | INTRAVENOUS | Status: AC
  Administered 2012-06-29: 3 g via INTRAVENOUS
  Filled 2012-06-28: qty 3000

## 2012-06-29 ENCOUNTER — Encounter (HOSPITAL_COMMUNITY): Payer: Self-pay | Admitting: *Deleted

## 2012-06-29 ENCOUNTER — Encounter (HOSPITAL_COMMUNITY): Payer: Self-pay | Admitting: Anesthesiology

## 2012-06-29 ENCOUNTER — Inpatient Hospital Stay (HOSPITAL_COMMUNITY): Payer: Medicare PPO

## 2012-06-29 ENCOUNTER — Encounter (HOSPITAL_COMMUNITY)
Admission: RE | Disposition: A | Discharge status: Home Health | Payer: Self-pay | Source: Ambulatory Visit | Attending: Specialist

## 2012-06-29 ENCOUNTER — Inpatient Hospital Stay (HOSPITAL_COMMUNITY)
Admission: RE | Admit: 2012-06-29 | Discharge: 2012-07-03 | DRG: 460 | Disposition: A | Discharge status: Home Health | Payer: Medicare PPO | Source: Ambulatory Visit | Attending: Specialist | Admitting: Specialist

## 2012-06-29 ENCOUNTER — Inpatient Hospital Stay (HOSPITAL_COMMUNITY): Payer: Medicare PPO | Admitting: Anesthesiology

## 2012-06-29 DIAGNOSIS — T84498A Other mechanical complication of other internal orthopedic devices, implants and grafts, initial encounter: Secondary | ICD-10-CM | POA: Diagnosis present

## 2012-06-29 DIAGNOSIS — N189 Chronic kidney disease, unspecified: Secondary | ICD-10-CM | POA: Diagnosis present

## 2012-06-29 DIAGNOSIS — K560 Paralytic ileus: Secondary | ICD-10-CM | POA: Diagnosis not present

## 2012-06-29 DIAGNOSIS — Y834 Other reconstructive surgery as the cause of abnormal reaction of the patient, or of later complication, without mention of misadventure at the time of the procedure: Secondary | ICD-10-CM | POA: Diagnosis not present

## 2012-06-29 DIAGNOSIS — Z01812 Encounter for preprocedural laboratory examination: Secondary | ICD-10-CM

## 2012-06-29 DIAGNOSIS — Z8249 Family history of ischemic heart disease and other diseases of the circulatory system: Secondary | ICD-10-CM

## 2012-06-29 DIAGNOSIS — K9189 Other postprocedural complications and disorders of digestive system: Secondary | ICD-10-CM | POA: Diagnosis not present

## 2012-06-29 DIAGNOSIS — F17200 Nicotine dependence, unspecified, uncomplicated: Secondary | ICD-10-CM | POA: Diagnosis present

## 2012-06-29 DIAGNOSIS — Y92009 Unspecified place in unspecified non-institutional (private) residence as the place of occurrence of the external cause: Secondary | ICD-10-CM

## 2012-06-29 DIAGNOSIS — M48062 Spinal stenosis, lumbar region with neurogenic claudication: Secondary | ICD-10-CM | POA: Diagnosis present

## 2012-06-29 DIAGNOSIS — M4310 Spondylolisthesis, site unspecified: Principal | ICD-10-CM | POA: Diagnosis present

## 2012-06-29 DIAGNOSIS — E109 Type 1 diabetes mellitus without complications: Secondary | ICD-10-CM | POA: Diagnosis present

## 2012-06-29 DIAGNOSIS — K5900 Constipation, unspecified: Secondary | ICD-10-CM | POA: Diagnosis present

## 2012-06-29 DIAGNOSIS — E785 Hyperlipidemia, unspecified: Secondary | ICD-10-CM | POA: Diagnosis present

## 2012-06-29 DIAGNOSIS — K929 Disease of digestive system, unspecified: Secondary | ICD-10-CM | POA: Diagnosis not present

## 2012-06-29 DIAGNOSIS — F3289 Other specified depressive episodes: Secondary | ICD-10-CM | POA: Diagnosis present

## 2012-06-29 DIAGNOSIS — K567 Ileus, unspecified: Secondary | ICD-10-CM | POA: Diagnosis not present

## 2012-06-29 DIAGNOSIS — E669 Obesity, unspecified: Secondary | ICD-10-CM | POA: Diagnosis present

## 2012-06-29 DIAGNOSIS — D62 Acute posthemorrhagic anemia: Secondary | ICD-10-CM | POA: Diagnosis not present

## 2012-06-29 DIAGNOSIS — I129 Hypertensive chronic kidney disease with stage 1 through stage 4 chronic kidney disease, or unspecified chronic kidney disease: Secondary | ICD-10-CM | POA: Diagnosis present

## 2012-06-29 DIAGNOSIS — Y831 Surgical operation with implant of artificial internal device as the cause of abnormal reaction of the patient, or of later complication, without mention of misadventure at the time of the procedure: Secondary | ICD-10-CM | POA: Diagnosis present

## 2012-06-29 DIAGNOSIS — F329 Major depressive disorder, single episode, unspecified: Secondary | ICD-10-CM | POA: Diagnosis present

## 2012-06-29 LAB — GLUCOSE, CAPILLARY
Glucose-Capillary: 206 mg/dL — ABNORMAL HIGH (ref 70–99)
Glucose-Capillary: 208 mg/dL — ABNORMAL HIGH (ref 70–99)

## 2012-06-29 SURGERY — POSTERIOR LUMBAR FUSION 3 WITH HARDWARE REMOVAL
Anesthesia: General | Site: Back | Wound class: Clean

## 2012-06-29 MED ORDER — METHOCARBAMOL 500 MG PO TABS
500.0000 mg | ORAL_TABLET | Freq: Four times a day (QID) | ORAL | Status: DC | PRN
  Administered 2012-06-30 – 2012-07-02 (×2): 500 mg via ORAL
  Filled 2012-06-29 (×3): qty 1

## 2012-06-29 MED ORDER — ACETAMINOPHEN 650 MG RE SUPP: 650.0000 mg | RECTAL | Status: DC | PRN

## 2012-06-29 MED ORDER — HYDROMORPHONE HCL PF 1 MG/ML IJ SOLN: 0.2500 mg | INTRAMUSCULAR | Status: DC | PRN

## 2012-06-29 MED ORDER — PROPOFOL 10 MG/ML IV EMUL: INTRAVENOUS | Status: DC | PRN

## 2012-06-29 MED ORDER — GLYCOPYRROLATE 0.2 MG/ML IJ SOLN
INTRAMUSCULAR | Status: DC | PRN
  Administered 2012-06-29: 0.4 mg via INTRAVENOUS

## 2012-06-29 MED ORDER — THROMBIN 20000 UNITS EX SOLR
OROMUCOSAL | Status: DC | PRN
  Administered 2012-06-29: 13:00:00 via TOPICAL

## 2012-06-29 MED ORDER — NEOSTIGMINE METHYLSULFATE 1 MG/ML IJ SOLN
INTRAMUSCULAR | Status: DC | PRN
  Administered 2012-06-29: 3 mg via INTRAVENOUS

## 2012-06-29 MED ORDER — BUPIVACAINE-EPINEPHRINE (PF) 0.5% -1:200000 IJ SOLN
INTRAMUSCULAR | Status: AC
  Filled 2012-06-29: qty 10

## 2012-06-29 MED ORDER — LIDOCAINE HCL (CARDIAC) 20 MG/ML IV SOLN
INTRAVENOUS | Status: DC | PRN
  Administered 2012-06-29: 60 mg via INTRAVENOUS

## 2012-06-29 MED ORDER — FENTANYL CITRATE 0.05 MG/ML IJ SOLN
INTRAMUSCULAR | Status: DC | PRN
  Administered 2012-06-29: 50 ug via INTRAVENOUS
  Administered 2012-06-29: 250 ug via INTRAVENOUS
  Administered 2012-06-29 (×4): 50 ug via INTRAVENOUS
  Administered 2012-06-29: 100 ug via INTRAVENOUS
  Administered 2012-06-29: 50 ug via INTRAVENOUS
  Administered 2012-06-29: 100 ug via INTRAVENOUS
  Administered 2012-06-29 (×10): 50 ug via INTRAVENOUS
  Administered 2012-06-29: 100 ug via INTRAVENOUS
  Administered 2012-06-29 (×3): 50 ug via INTRAVENOUS

## 2012-06-29 MED ORDER — CEFAZOLIN SODIUM 1-5 GM-% IV SOLN
INTRAVENOUS | Status: AC
  Filled 2012-06-29: qty 100

## 2012-06-29 MED ORDER — ACETAMINOPHEN 325 MG PO TABS: 650.0000 mg | ORAL_TABLET | ORAL | Status: DC | PRN

## 2012-06-29 MED ORDER — SODIUM CHLORIDE 0.9 % IV SOLN
INTRAVENOUS | Status: DC | PRN
  Administered 2012-06-29: 16:00:00 via INTRAVENOUS

## 2012-06-29 MED ORDER — SODIUM CHLORIDE 0.9 % IV SOLN: 250.0000 mL | INTRAVENOUS | Status: DC

## 2012-06-29 MED ORDER — NALOXONE HCL 0.4 MG/ML IJ SOLN: 0.4000 mg | INTRAMUSCULAR | Status: DC | PRN

## 2012-06-29 MED ORDER — ONDANSETRON HCL 4 MG/2ML IJ SOLN: 4.0000 mg | Freq: Four times a day (QID) | INTRAMUSCULAR | Status: DC | PRN

## 2012-06-29 MED ORDER — LACTATED RINGERS IV SOLN
INTRAVENOUS | Status: DC
  Administered 2012-06-29: 11:00:00 via INTRAVENOUS

## 2012-06-29 MED ORDER — MIDAZOLAM HCL 5 MG/5ML IJ SOLN
INTRAMUSCULAR | Status: DC | PRN
  Administered 2012-06-29: 2 mg via INTRAVENOUS

## 2012-06-29 MED ORDER — HYDROCODONE-ACETAMINOPHEN 5-325 MG PO TABS
1.0000 | ORAL_TABLET | ORAL | Status: DC | PRN
  Administered 2012-06-30 – 2012-07-03 (×10): 2 via ORAL
  Filled 2012-06-29 (×10): qty 2

## 2012-06-29 MED ORDER — MENTHOL 3 MG MT LOZG: 1.0000 | LOZENGE | OROMUCOSAL | Status: DC | PRN

## 2012-06-29 MED ORDER — HYDROMORPHONE HCL PF 1 MG/ML IJ SOLN: 0.5000 mg | INTRAMUSCULAR | Status: DC | PRN

## 2012-06-29 MED ORDER — ACETAMINOPHEN 10 MG/ML IV SOLN
INTRAVENOUS | Status: AC
  Filled 2012-06-29: qty 100

## 2012-06-29 MED ORDER — MAGNESIUM CITRATE PO SOLN
1.0000 | Freq: Once | ORAL | Status: AC | PRN
  Filled 2012-06-29: qty 296

## 2012-06-29 MED ORDER — PHENOL 1.4 % MT LIQD
1.0000 | OROMUCOSAL | Status: DC | PRN
  Filled 2012-06-29: qty 177

## 2012-06-29 MED ORDER — PANTOPRAZOLE SODIUM 40 MG IV SOLR
40.0000 mg | Freq: Every day | INTRAVENOUS | Status: DC
  Administered 2012-06-29 – 2012-07-02 (×4): 40 mg via INTRAVENOUS
  Filled 2012-06-29 (×5): qty 40

## 2012-06-29 MED ORDER — SORBITOL 70 % SOLN
30.0000 mL | Freq: Every day | Status: DC | PRN
  Filled 2012-06-29: qty 30

## 2012-06-29 MED ORDER — DIPHENHYDRAMINE HCL 50 MG/ML IJ SOLN: 12.5000 mg | Freq: Four times a day (QID) | INTRAMUSCULAR | Status: DC | PRN

## 2012-06-29 MED ORDER — KETOROLAC TROMETHAMINE 30 MG/ML IJ SOLN
30.0000 mg | Freq: Once | INTRAMUSCULAR | Status: AC
Start: 2012-06-29 — End: 2012-06-29
  Administered 2012-06-29: 30 mg via INTRAVENOUS

## 2012-06-29 MED ORDER — VECURONIUM BROMIDE 10 MG IV SOLR
INTRAVENOUS | Status: DC | PRN
  Administered 2012-06-29 (×2): 3 mg via INTRAVENOUS
  Administered 2012-06-29: 4 mg via INTRAVENOUS
  Administered 2012-06-29: 3 mg via INTRAVENOUS
  Administered 2012-06-29: 4 mg via INTRAVENOUS
  Administered 2012-06-29: 3 mg via INTRAVENOUS

## 2012-06-29 MED ORDER — SODIUM CHLORIDE 0.9 % IJ SOLN: 3.0000 mL | INTRAMUSCULAR | Status: DC | PRN

## 2012-06-29 MED ORDER — PHENYLEPHRINE HCL 10 MG/ML IJ SOLN
INTRAMUSCULAR | Status: DC | PRN
  Administered 2012-06-29: 40 ug via INTRAVENOUS

## 2012-06-29 MED ORDER — SODIUM CHLORIDE 0.9 % IJ SOLN: 9.0000 mL | INTRAMUSCULAR | Status: DC | PRN

## 2012-06-29 MED ORDER — EPHEDRINE SULFATE 50 MG/ML IJ SOLN
INTRAMUSCULAR | Status: DC | PRN
  Administered 2012-06-29 (×2): 10 mg via INTRAVENOUS

## 2012-06-29 MED ORDER — OXYCODONE-ACETAMINOPHEN 5-325 MG PO TABS: 1.0000 | ORAL_TABLET | ORAL | Status: DC | PRN

## 2012-06-29 MED ORDER — ONDANSETRON HCL 4 MG/2ML IJ SOLN: 4.0000 mg | INTRAMUSCULAR | Status: DC | PRN

## 2012-06-29 MED ORDER — DIPHENHYDRAMINE HCL 12.5 MG/5ML PO ELIX
12.5000 mg | ORAL_SOLUTION | Freq: Four times a day (QID) | ORAL | Status: DC | PRN
  Filled 2012-06-29: qty 5

## 2012-06-29 MED ORDER — ALBUMIN HUMAN 5 % IV SOLN
INTRAVENOUS | Status: DC | PRN
  Administered 2012-06-29 (×2): via INTRAVENOUS

## 2012-06-29 MED ORDER — DOCUSATE SODIUM 100 MG PO CAPS
100.0000 mg | ORAL_CAPSULE | Freq: Two times a day (BID) | ORAL | Status: DC
  Administered 2012-06-29 – 2012-07-02 (×7): 100 mg via ORAL
  Filled 2012-06-29 (×9): qty 1

## 2012-06-29 MED ORDER — POLYETHYLENE GLYCOL 3350 17 G PO PACK
17.0000 g | PACK | Freq: Every day | ORAL | Status: DC | PRN
  Filled 2012-06-29 (×2): qty 1

## 2012-06-29 MED ORDER — ZOLPIDEM TARTRATE 5 MG PO TABS: 5.0000 mg | ORAL_TABLET | Freq: Every evening | ORAL | Status: DC | PRN

## 2012-06-29 MED ORDER — LIDOCAINE HCL 4 % MT SOLN
OROMUCOSAL | Status: DC | PRN
  Administered 2012-06-29: 4 mL via TOPICAL

## 2012-06-29 MED ORDER — BUPIVACAINE-EPINEPHRINE 0.5% -1:200000 IJ SOLN
INTRAMUSCULAR | Status: DC | PRN
  Administered 2012-06-29: 20 mL

## 2012-06-29 MED ORDER — ROCURONIUM BROMIDE 100 MG/10ML IV SOLN
INTRAVENOUS | Status: DC | PRN
  Administered 2012-06-29: 20 mg via INTRAVENOUS
  Administered 2012-06-29: 50 mg via INTRAVENOUS
  Administered 2012-06-29 (×2): 10 mg via INTRAVENOUS

## 2012-06-29 MED ORDER — SODIUM CHLORIDE 0.9 % IJ SOLN
3.0000 mL | Freq: Two times a day (BID) | INTRAMUSCULAR | Status: DC
  Administered 2012-06-30: 3 mL via INTRAVENOUS

## 2012-06-29 MED ORDER — CEFAZOLIN SODIUM 1-5 GM-% IV SOLN
1.0000 g | Freq: Three times a day (TID) | INTRAVENOUS | Status: AC
  Administered 2012-06-29 – 2012-06-30 (×2): 1 g via INTRAVENOUS
  Filled 2012-06-29 (×2): qty 50

## 2012-06-29 MED ORDER — MORPHINE SULFATE 10 MG/ML IJ SOLN
INTRAMUSCULAR | Status: DC | PRN
  Administered 2012-06-29 (×2): 5 mg via INTRAVENOUS

## 2012-06-29 MED ORDER — SODIUM CHLORIDE 0.9 % IV SOLN
INTRAVENOUS | Status: DC
  Administered 2012-06-29: 23:00:00 via INTRAVENOUS
  Administered 2012-06-30: 1000 mL via INTRAVENOUS
  Administered 2012-06-30 – 2012-07-02 (×4): via INTRAVENOUS

## 2012-06-29 MED ORDER — ONDANSETRON HCL 4 MG/2ML IJ SOLN
INTRAMUSCULAR | Status: DC | PRN
  Administered 2012-06-29: 4 mg via INTRAVENOUS

## 2012-06-29 MED ORDER — INSULIN ASPART 100 UNIT/ML ~~LOC~~ SOLN
0.0000 [IU] | SUBCUTANEOUS | Status: DC
  Administered 2012-06-30 (×5): 3 [IU] via SUBCUTANEOUS
  Administered 2012-06-30: 5 [IU] via SUBCUTANEOUS
  Administered 2012-07-01: 3 [IU] via SUBCUTANEOUS
  Administered 2012-07-01 (×2): 5 [IU] via SUBCUTANEOUS
  Administered 2012-07-01 (×2): 3 [IU] via SUBCUTANEOUS
  Administered 2012-07-01: 5 [IU] via SUBCUTANEOUS
  Administered 2012-07-02: 3 [IU] via SUBCUTANEOUS
  Administered 2012-07-02: 2 [IU] via SUBCUTANEOUS
  Administered 2012-07-02: 3 [IU] via SUBCUTANEOUS
  Administered 2012-07-02: 5 [IU] via SUBCUTANEOUS
  Administered 2012-07-02: 2 [IU] via SUBCUTANEOUS

## 2012-06-29 MED ORDER — THROMBIN 20000 UNITS EX SOLR
CUTANEOUS | Status: AC
  Filled 2012-06-29: qty 20000

## 2012-06-29 MED ORDER — ONDANSETRON HCL 4 MG/2ML IJ SOLN: 4.0000 mg | Freq: Once | INTRAMUSCULAR | Status: DC | PRN

## 2012-06-29 MED ORDER — PROPOFOL 10 MG/ML IV BOLUS
INTRAVENOUS | Status: DC | PRN
  Administered 2012-06-29 (×5): 20 mg via INTRAVENOUS
  Administered 2012-06-29: 160 mg via INTRAVENOUS

## 2012-06-29 MED ORDER — CEFAZOLIN SODIUM-DEXTROSE 2-3 GM-% IV SOLR
INTRAVENOUS | Status: DC | PRN
  Administered 2012-06-29: 2 g via INTRAVENOUS

## 2012-06-29 MED ORDER — MORPHINE SULFATE (PF) 1 MG/ML IV SOLN
INTRAVENOUS | Status: DC
  Administered 2012-06-29: 20:00:00 via INTRAVENOUS
  Administered 2012-06-30: 24 mg via INTRAVENOUS
  Administered 2012-06-30: 20:00:00 via INTRAVENOUS
  Administered 2012-06-30: 25.35 mg via INTRAVENOUS
  Administered 2012-06-30: 24.8 mg via INTRAVENOUS
  Administered 2012-06-30: 24.5 mg via INTRAVENOUS
  Administered 2012-07-01 (×2): via INTRAVENOUS
  Administered 2012-07-01: 25.45 mg via INTRAVENOUS
  Administered 2012-07-01: 20.92 mg via INTRAVENOUS
  Filled 2012-06-29 (×7): qty 25

## 2012-06-29 MED ORDER — MORPHINE SULFATE (PF) 1 MG/ML IV SOLN
INTRAVENOUS | Status: AC
  Filled 2012-06-29: qty 25

## 2012-06-29 MED ORDER — CHLORHEXIDINE GLUCONATE 4 % EX LIQD: 60.0000 mL | Freq: Once | CUTANEOUS | Status: DC

## 2012-06-29 MED ORDER — METHOCARBAMOL 100 MG/ML IJ SOLN
500.0000 mg | Freq: Four times a day (QID) | INTRAMUSCULAR | Status: DC | PRN
  Administered 2012-06-30 – 2012-07-01 (×2): 500 mg via INTRAVENOUS
  Filled 2012-06-29 (×3): qty 5

## 2012-06-29 MED ORDER — LACTATED RINGERS IV SOLN
INTRAVENOUS | Status: DC | PRN
  Administered 2012-06-29 (×4): via INTRAVENOUS

## 2012-06-29 MED ORDER — KETOROLAC TROMETHAMINE 30 MG/ML IJ SOLN
INTRAMUSCULAR | Status: AC
  Filled 2012-06-29: qty 1

## 2012-06-29 MED ORDER — 0.9 % SODIUM CHLORIDE (POUR BTL) OPTIME
TOPICAL | Status: DC | PRN
  Administered 2012-06-29: 1000 mL

## 2012-06-29 MED ORDER — ACETAMINOPHEN 10 MG/ML IV SOLN
INTRAVENOUS | Status: DC | PRN
  Administered 2012-06-29: 1000 mg via INTRAVENOUS

## 2012-06-29 MED ORDER — ALUM & MAG HYDROXIDE-SIMETH 200-200-20 MG/5ML PO SUSP
30.0000 mL | Freq: Four times a day (QID) | ORAL | Status: DC | PRN
  Administered 2012-06-30: 30 mL via ORAL
  Filled 2012-06-29: qty 30

## 2012-06-29 MED ORDER — LACTATED RINGERS IV SOLN
INTRAVENOUS | Status: DC | PRN
  Administered 2012-06-29 (×2): via INTRAVENOUS

## 2012-06-29 MED ORDER — INSULIN GLARGINE 100 UNIT/ML ~~LOC~~ SOLN
10.0000 [IU] | Freq: Every day | SUBCUTANEOUS | Status: DC
  Administered 2012-06-29 – 2012-07-02 (×4): 10 [IU] via SUBCUTANEOUS

## 2012-06-29 SURGICAL SUPPLY — 70 items
ADH SKN CLS APL DERMABOND .7 (GAUZE/BANDAGES/DRESSINGS) ×1
BLADE SURG ROTATE 9660 (MISCELLANEOUS) IMPLANT
BUR ROUND FLUTED 4 SOFT TCH (BURR) ×2 IMPLANT
CAGE CONCORDE BULLET 9X8X27 (Cage) ×2 IMPLANT
CAGE SPNL 5D BLT NOSE 27X9X8X (Cage) IMPLANT
CLOTH BEACON ORANGE TIMEOUT ST (SAFETY) ×2 IMPLANT
CORDS BIPOLAR (ELECTRODE) ×2 IMPLANT
COVER MAYO STAND STRL (DRAPES) ×4 IMPLANT
COVER SURGICAL LIGHT HANDLE (MISCELLANEOUS) ×2 IMPLANT
DERMABOND ADVANCED (GAUZE/BANDAGES/DRESSINGS) ×1
DERMABOND ADVANCED .7 DNX12 (GAUZE/BANDAGES/DRESSINGS) ×1 IMPLANT
DRAPE C-ARM 42X72 X-RAY (DRAPES) ×4 IMPLANT
DRAPE SURG 17X23 STRL (DRAPES) ×6 IMPLANT
DRAPE TABLE COVER HEAVY DUTY (DRAPES) ×2 IMPLANT
DRSG MEPILEX BORDER 4X12 (GAUZE/BANDAGES/DRESSINGS) ×1 IMPLANT
DRSG MEPILEX BORDER 4X4 (GAUZE/BANDAGES/DRESSINGS) ×1 IMPLANT
DRSG MEPILEX BORDER 4X8 (GAUZE/BANDAGES/DRESSINGS) ×1 IMPLANT
DURAPREP 26ML APPLICATOR (WOUND CARE) ×2 IMPLANT
ELECT BLADE 6.5 EXT (BLADE) ×1 IMPLANT
ELECT CAUTERY BLADE 6.4 (BLADE) ×2 IMPLANT
ELECT REM PT RETURN 9FT ADLT (ELECTROSURGICAL) ×2
ELECTRODE REM PT RTRN 9FT ADLT (ELECTROSURGICAL) ×1 IMPLANT
EVACUATOR 1/8 PVC DRAIN (DRAIN) IMPLANT
GLOVE BIO SURGEON STRL SZ7 (GLOVE) ×3 IMPLANT
GLOVE BIOGEL PI IND STRL 7.0 (GLOVE) IMPLANT
GLOVE BIOGEL PI IND STRL 7.5 (GLOVE) ×1 IMPLANT
GLOVE BIOGEL PI INDICATOR 7.0 (GLOVE) ×2
GLOVE BIOGEL PI INDICATOR 7.5 (GLOVE) ×3
GLOVE ECLIPSE 7.0 STRL STRAW (GLOVE) ×4 IMPLANT
GLOVE ECLIPSE 8.5 STRL (GLOVE) ×2 IMPLANT
GLOVE SURG 8.5 LATEX PF (GLOVE) ×2 IMPLANT
GLOVE SURG SS PI 6.5 STRL IVOR (GLOVE) ×2 IMPLANT
GOWN PREVENTION PLUS LG XLONG (DISPOSABLE) ×2 IMPLANT
GOWN PREVENTION PLUS XXLARGE (GOWN DISPOSABLE) ×2 IMPLANT
GOWN STRL NON-REIN LRG LVL3 (GOWN DISPOSABLE) ×5 IMPLANT
HEMOSTAT SURGICEL 2X14 (HEMOSTASIS) ×1 IMPLANT
KIT BASIN OR (CUSTOM PROCEDURE TRAY) ×2 IMPLANT
KIT ROOM TURNOVER OR (KITS) ×2 IMPLANT
NDL ASP BONE MRW 11GX15 (NEEDLE) IMPLANT
NDL ASP BONE MRW 11GX15 J (NEEDLE) IMPLANT
NEEDLE 22X1 1/2 (OR ONLY) (NEEDLE) ×2 IMPLANT
NEEDLE ASP BONE MRW 11GX15 (NEEDLE) IMPLANT
NEEDLE ASP BONE MRW 11GX15 J (NEEDLE) ×2 IMPLANT
NEEDLE BONE MARROW 8GAX6 (NEEDLE) IMPLANT
NS IRRIG 1000ML POUR BTL (IV SOLUTION) ×2 IMPLANT
PACK LAMINECTOMY ORTHO (CUSTOM PROCEDURE TRAY) ×2 IMPLANT
PAD ARMBOARD 7.5X6 YLW CONV (MISCELLANEOUS) ×4 IMPLANT
PATTIES SURGICAL .75X.75 (GAUZE/BANDAGES/DRESSINGS) ×2 IMPLANT
PATTIES SURGICAL 1X1 (DISPOSABLE) ×1 IMPLANT
ROD PREBENT 6.35X95MM (Rod) ×1 IMPLANT
SCREW EXPEDIUM POLY 6.35 6X45 (Screw) ×2 IMPLANT
SCREW POLY EXP 6.35 7X45MM (Screw) ×6 IMPLANT
SCREW SET EXPEDIUM 6.35 (Screw) ×8 IMPLANT
SPONGE LAP 4X18 X RAY DECT (DISPOSABLE) ×6 IMPLANT
STRIP BONE FILLER SYN 100X25X3 (Orthopedic Implant) ×1 IMPLANT
SUT VIC AB 0 CT1 27 (SUTURE) ×2
SUT VIC AB 0 CT1 27XBRD ANBCTR (SUTURE) ×1 IMPLANT
SUT VIC AB 1 CTX 36 (SUTURE) ×4
SUT VIC AB 1 CTX36XBRD ANBCTR (SUTURE) ×2 IMPLANT
SUT VIC AB 2-0 CT1 27 (SUTURE) ×2
SUT VIC AB 2-0 CT1 TAPERPNT 27 (SUTURE) ×1 IMPLANT
SUT VICRYL 0 CT 1 36IN (SUTURE) ×4 IMPLANT
SUT VICRYL 4-0 PS2 18IN ABS (SUTURE) ×4 IMPLANT
SYR CONTROL 10ML LL (SYRINGE) ×4 IMPLANT
TOWEL OR 17X24 6PK STRL BLUE (TOWEL DISPOSABLE) ×2 IMPLANT
TOWEL OR 17X26 10 PK STRL BLUE (TOWEL DISPOSABLE) ×2 IMPLANT
TRAY FOLEY CATH 14FR (SET/KITS/TRAYS/PACK) ×2 IMPLANT
WATER STERILE IRR 1000ML POUR (IV SOLUTION) ×2 IMPLANT
YANKAUER SUCT BULB TIP NO VENT (SUCTIONS) ×2 IMPLANT
expedium 6.35 pre bent rod 85mm ×1 IMPLANT

## 2012-06-29 NOTE — Progress Notes (Signed)
Care of pt assumed by MA Allison Silva RN 

## 2012-06-29 NOTE — Anesthesia Procedure Notes (Signed)
Procedure Name: Intubation Date/Time: 06/29/2012 11:33 AM Performed by: Lovie Chol Pre-anesthesia Checklist: Patient identified, Emergency Drugs available, Suction available, Patient being monitored and Timeout performed Patient Re-evaluated:Patient Re-evaluated prior to inductionOxygen Delivery Method: Circle system utilized Preoxygenation: Pre-oxygenation with 100% oxygen Intubation Type: IV induction Ventilation: Mask ventilation without difficulty and Oral airway inserted - appropriate to patient size Laryngoscope Size: Miller and 3 Grade View: Grade I Tube type: Oral Tube size: 7.5 mm Number of attempts: 1 Airway Equipment and Method: Stylet and LTA kit utilized Placement Confirmation: ETT inserted through vocal cords under direct vision,  positive ETCO2 and breath sounds checked- equal and bilateral Secured at: 22 cm Tube secured with: Tape Dental Injury: Teeth and Oropharynx as per pre-operative assessment

## 2012-06-29 NOTE — Progress Notes (Signed)
Nasal airway d/cd without difficulty pt insturcted on PCA and used it for first time family visiting [facility] but arousable ,

## 2012-06-29 NOTE — Brief Op Note (Signed)
06/29/2012  6:15 PM  PATIENT:  Derrick Hinton  51 y.o. 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 POSTERIOR SEGMENTAL INSTRUMENTATION L3 TO S1 (4 levels)  SURGEON:  Surgeon(s) and Role:    Kerrin Champagne, MD - Primary  PHYSICIAN ASSISTANT: Maud Deed PA-C  ANESTHESIA:   local and general, Dr. Diamantina Monks  EBL:  Total I/O In: 5735 [I.V.:4700; Blood:785; IV Piggyback:250] Out: 2200 [Urine:700; Blood:1500]  BLOOD ADMINISTERED:785 CC CELLSAVER  DRAINS: (One) Hemovact drain(s) in the left lumbar with  Suction Open and Urinary Catheter (Foley)   LOCAL MEDICATIONS USED:  MARCAINE 1/2% with 1/200,000 epi.  Amount: 20 ml  SPECIMEN:  No Specimen  DISPOSITION OF SPECIMEN:  N/A  COUNTS:  YES  TOURNIQUET:  * No tourniquets in log *  DICTATION: .Dragon Dictation  PLAN OF CARE: Admit to inpatient   PATIENT DISPOSITION:  PACU - hemodynamically stable.   Delay start of Pharmacological VTE agent (>24hrs) due to surgical blood loss or risk of bleeding: yes

## 2012-06-29 NOTE — H&P (Signed)
Patient examined and lab reviewed with Dondra Spry. Patient was seen and examined in the preop holding area. There has been no interval  Change in this patient's exam preop  history and physical exam  Lab tests and images have been examined and reviewed.  The Risks benefits and alternative treatments have been discussed  extensively,questions answered.  The patient has elected to undergo the discussed surgical treatment.

## 2012-06-29 NOTE — Progress Notes (Signed)
Small amount of bleeding around hemovac insertion dressing circled

## 2012-06-29 NOTE — Progress Notes (Signed)
Pt arrived with nasal airway in left nares

## 2012-06-29 NOTE — Transfer of Care (Signed)
Immediate Anesthesia Transfer of Care Note  Patient: Derrick Hinton  Procedure(s) Performed: Procedure(s) (LRB): POSTERIOR LUMBAR FUSION 3 WITH HARDWARE REMOVAL (N/A)  Patient Location: PACU  Anesthesia Type: General  Level of Consciousness: awake and oriented  Airway & Oxygen Therapy: Patient Spontanous Breathing and Patient connected to nasal cannula oxygen  Post-op Assessment: Report given to PACU RN and Post -op Vital signs reviewed and stable  Post vital signs: Reviewed and stable  Complications: No apparent anesthesia complications and Patient re-intubated

## 2012-06-29 NOTE — Anesthesia Postprocedure Evaluation (Signed)
Anesthesia Post Note  Patient: Derrick Hinton  Procedure(s) Performed: Procedure(s) (LRB): POSTERIOR LUMBAR FUSION 3 WITH HARDWARE REMOVAL (N/A)  Anesthesia type: general  Patient location: PACU  Post pain: Pain level controlled  Post assessment: Patient's Cardiovascular Status Stable  Last Vitals:  Filed Vitals:   06/29/12 1900  BP: 146/81  Pulse: 79  Temp:   Resp: 9    Post vital signs: Reviewed and stable  Level of consciousness: sedated  Complications: No apparent anesthesia complications

## 2012-06-29 NOTE — Anesthesia Preprocedure Evaluation (Addendum)
Anesthesia Evaluation  Patient identified by MRN, date of birth, ID band Patient awake    Reviewed: Allergy & Precautions, H&P , NPO status , Patient's Chart, lab work & pertinent test results  History of Anesthesia Complications Negative for: history of anesthetic complications  Airway Mallampati: II TM Distance: >3 FB Neck ROM: full    Dental   Pulmonary COPDCurrent Smoker (1 ppd x 15 years; quit in July 2013),          Cardiovascular hypertension, Pt. on medications Rhythm:regular Rate:Normal     Neuro/Psych PSYCHIATRIC DISORDERS    GI/Hepatic   Endo/Other  Poorly Controlled, Type 2, Oral Hypoglycemic Agents and Insulin Dependent  Renal/GU CRFRenal disease     Musculoskeletal   Abdominal   Peds  Hematology   Anesthesia Other Findings   Reproductive/Obstetrics                         Anesthesia Physical Anesthesia Plan  ASA: III  Anesthesia Plan: General   Post-op Pain Management:    Induction: Intravenous  Airway Management Planned: Oral ETT  Additional Equipment: Arterial line  Intra-op Plan:   Post-operative Plan: Extubation in OR  Informed Consent: I have reviewed the patients History and Physical, chart, labs and discussed the procedure including the risks, benefits and alternatives for the proposed anesthesia with the patient or authorized representative who has indicated his/her understanding and acceptance.     Plan Discussed with: CRNA, Anesthesiologist and Surgeon  Anesthesia Plan Comments:         Anesthesia Quick Evaluation

## 2012-06-29 NOTE — Op Note (Signed)
06/29/2012  8:03 PM  PATIENT:  Derrick Hinton  51 y.o. 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 POSTERIOR SEGMENTAL INSTRUMENTATION L3 TO S1 (4 levels)     SURGEON:  Kerrin Champagne, MD     ASSISTANT:  Maud Deed, PA-C  (Present throughout the entire procedure and necessary for completion of procedure in a timely manner)     ANESTHESIA:  General,supplemented with local marcaine 1/2% with 1/200,000 epi Dr. Diamantina Monks    COMPLICATIONS:  None.     COMPONENTS: Depuy concord cage 8mm lordotic L3-4.  L3-S1 Depuy expedeium pedicle screws and rods.  PROCEDURE:The patient was met in the holding area, and the appropriate lumbar level Left L3-4  identified and marked with an x and my initials.The patient was then transported to OR. The patient was then placed under  general anesthesia without difficulty.The patient received appropriate preoperative antibiotic prophylaxis ancef.  Nursing staff inserted a Foley catheter under sterile conditions. He was then turned to a prone position [place] spine table was used for this case. All pressure points were well padded PAS stocking applied bilateral lower extremity to prevent DVT. Standard prep DuraPrep solution. Draped in the usual manner. Time-out procedure was called and correct .  The old incision scar was ellipsed L4-S1 and carried superiorly an additional 2 levels to the L1 spinous process and  the incision carried inferiorly an additional one level to the S2 spinous process.  Bovie electric cautery was used to control bleeding and carefully dissection was carried down along the lateral aspects of the spinous process of L2 to S2. Cobb then used to carefully elevate the paralumbar muscle and the incision in the midline was carried to to the level of the base of the residual spinous processes. The previous fusion area extending from the base of the spinous process of L4 to the superior aspect of S1 was carried expose at its edges debrided the scar tissue using a large curette. A Kocher clamps were at the L2-3 and L3-4 interspinous process space and intraoperative lateral confirmation of the appropriate level with C-arm sterilely draped. Exposure was then carried out to the lateral aspects of the posterior spine exposing the bilateral [employer] pedicle screw and rod fixation from L4-S1 bilaterally. Using [employer] instruments then the caps to the fasteners at each of the 3 levels both sides were then removed and soft tissue debrided from the instrumentation and the rods then removed. A 4.5 hex screwdriver was then used to remove each of the upper 4 screws at the L4 and L5 levels. Then first on the left side the S1 screw fastener and superficial portion of the broken screw was removed. Each of the screws measured 6.5 mm x 45 mm length. Using the [employer] Universal hardware removal kit the first trephine hand reaming was carried out over the left sided deep portion of the threaded screw that was broken. After carefully removing bone from approximately 3-4 mm of the superficial portion of the screw a reverse threaded easy out was then placed  over the residual screw and after multiple attempts of debridement and trials using different sized trephines and easy outs eventually the deep portion of the screw was able to be removed with a reverse threaded 6 mm easy out. This completed and then the left side S1  pedicle screw fastener and superficial portion of broken pedicle screw was removed and similar to the opposite side trephines were directed about the deep portion of the fractured screw exposing circumferentially about the superficial portion of this broken screw about 3 or 4 mm in depth. Again multiple trials of trephines and easy outs measuring 5-7 mm were used and eventually a 5 mm easy out capture the superficial portion of the deeper portion of the fractured screw and this was able to be delivered and removed. Following this then the transverse processes of the  L3, L4, L5 and the sacral ala were carefully exposed and debrided of soft tissue.These were then packed with sponges for hemostasis.Decompression of the L3-4 level was carried down using osteotomes to resect the inferior articular process of L3 bilaterally and medial aspect of the L3-4 facet and superior aspect of the lamina of L4 resecting hypertrophic ligamentum flavum on both the right and left sides decompressing bilateral L3 and L4 neuroforamen so that a hockey-stick nerve probe could be passed out of L3 and L4 neuroforamen demonstrating their decompression.  Irrigation was then carried out and attention turned to the TLIF on the left side at L3-4.  Osteotome was then used to resect the inferior articular process of left L3 removing with 3 mm Kerrison and Penfield 4 and pituitary rongeur. Carefully the medial aspect of the superior tip of process of L4 was freed up of scar tissue off of the adjacent thecal sac. Osteotome used to perform osteotomy of the superior articular process on the left side at L4 decompressing the lateral recess. This was done using greenstick osteotome technique and resected using a 3 mm Kerrison and curettes residual bone remaining in the lateral recess was resected using 3mm Kerrisons.  Loupe magnification and headlight were used for this portion of the procedure. Residual pars interarticularis overlying the left L3  neuroforamen was then resected using 2 and 3 mm Kerrisons. The left L3 nerve root well decompressed. Hockey-stick nerve probe was it will be passed out the left L3 neuroforamen and identifying the superior aspect of the L4 pedicle then osteotome used to resect the superior articular process of L4 transversely at the superior level of the L4 pedicle. This allowed for a wide decompression left L3 neuroforamen. L4 nerve root and lateral aspect of the thecal sac at the L3-4 level was then able to be mobilized with Penfield 4 and the disc space at the L3-4 level identified and the nerve structures retracted with Derricho retractor. 15 blade scalpel used to incise the disc the left side. L3-4 discectomy on the left side carried out with pituitary ronguers. Following debridement of degenerative disc material from the left side L3-4 disc, the disc space was dilated using 7 mm through 9 mm disc space dilators. A laminar spreader was inserted between the spinous process  base L3 and the central lamina of L4 excellent distraction of the disc space was obtained. The disc space was then prepared using straight curette up-biting right and up-biting left curette and ring curettes. Degenerated disc as well as a cartilage endplates were debrided from the disc place using pituitary rongeurs,currettes. The disc space examined demonstrating good bleeding endplate bone surfaces. Bone graft that  was harvested from the facet as well as from the spinous processes of L4, L5, S1 and half of the L3 and was placed into the intervertebral disc space after first sounding the disc space for the correct cage size. Cage chosen was a 8 mm Concorde lordotic cage. Local bone graft was then impacted into the intervertebral disc space at L3-4 first placing within the disc space using a forceps then impacted with an 7 mm trial cage. After performing this 3 times  the permanent 8 mm Concorde lordotic cage packed with bone graft was then inserted in  approximately 15-20 of convergence. Careful inspection of the spinal canal demonstrated no surgery bone graft within the spinal canal both the L3 and L4 nerve roots appeared to exit without further compression.  C-arm images were used to confirm  the permanent cage implant as well as positioning of the implant well within the intervertebral disc this cage was subset deep to the posterior aspect of the disc space by about 3-4 mm. Attention then turned to performance of insertion of pedicle screws the L3 level.   Normal was then used to make an entry point into the intersection of the lateral aspect of the L3 pedicle with left L3 transverse process observed on C-arm fluoroscopy to be in good position alignment with the pedicle of L3 observed on lateral view. Handle held pedicle probe was then used to make an opening into the central portions of the pedicle of L3 on the left side. C-arm fluoroscopy used to verify the position alignment of the pedicle probe depth at 45 mm. Trocar used for aspiration of bone marrow was then inserted after first checking the pedicle opening using a ball-tipped probe. Ball-tipped probe indicated that there was no broaching of the cortex within the pedicle opening on the left L3 aspiration equipment was then placed and 10 cc of bone marrow aspirate obtained and was used to charge a 10 cc strip of the Chronos calcium oxalate charge collagen sponge. This was then divided into sections similar to tooth picks for posterior lateral bone graft purposes. Decortication of the transverse process of the L3  was then carried out using a high-speed bur. The bone marrow charged chronos was applied to the area between the transverse process of L3 and  and the posterior transverse process of L4 . Ball-tipped probe was then used to probe the channel placed on the left side at L3 pedicle again showing no broaching cortex. The 45 mm x 6 pedicle expedieum screw was then inserted at the left L3 level in the  appropriate degree of convergence and lordosis. Attention then turned to the left L4 pedicle screw insertion,  the superior articular process L4 was debrided and resected. A previous entry point into the L4 pedicle identified using the ball-tipped probe.Converging medially C-arm used to verify the alignment for the L4  pedicle. The pedicle was then probed to a depth of 45 mm using the  ball-tip  probe. Decortication of the  L4 transverse process was carried out using a high-speed bur and bone graft applied. Check made with ball-tip probe to ensure no penetration of the cortex of the L4  pedicle. A 7 mm x 45 and mm expedieum screw was then inserted degree of lordosis and in the correct degree of convergence. The C-arm was used to verify the position alignment of pedicle screw. Attention then turned to the left L5 pedicle screw insertion,  the superior articular process was debrided and resected. A previous  entry point into the L5 pedicle identified using the ball-tipped probe.Converging medially C-arm used to verify the alignment for the L5 pedicle. The pedicle was then probed to a depth of 45 mm using the  ball-tip  probe. Decortication of the  L5 transverse process was carried out using a high-speed bur and bone graft applied. Check made with ball-tip probe to ensure no penetration of the cortex of the L5 pedicle. A 7 mm x 45 and mm expedieum screw was then inserted degree of lordosis and in the correct degree of convergence. The C-arm was used to verify the position alignment of pedicle screw.  Attention then turned to the left S1 pedicle screw placement,  the superior articular process was debrided and resected. A previous entry point into the S1 pedicle identified using the ball-tipped probe.Converging medially C-arm used to verify the alignment for the S1 pedicle. The pedicle was then probed to a depth of 45 mm using the  ball-tip  probe. Decortication of the  S1 sacral ala was carried out using a high-speed  bur and bone graft applied. Check made with ball-tip probe to ensure no penetration of the cortex of the S1 pedicle. Portions of bone graft were then placed into the previous opening for the S1 pedicles that had been opened to allow for move full of broken screws at this level previously. A 7 mm x 45 and mm expedieum screw was then inserted degree of lordosis and in the correct degree of convergence. The C-arm was used to verify the position alignment of pedicle screw. A 85 mm precontoured rod was then carefully inserted into the fasteners extending from L3-S1 on the left side. The fastener caps at L 3, L4, L5 and S1 level were then placed and the L3 fastener cap torqued to 80 foot-pounds. Compression between the fasteners L3 and L4 was then performed and the cap at the L4 level was tightened to 80 foot pounds. The remaining fastener caps at L5 and S1 were then tightened to 80 foot-pounds in neutral compression. Attention then turned to the placement of the pedicle screw on the right side at L3  this was done similar to the right  side. Awl used to make an initial entry point verified with C-arm fluoroscopy then a hand-held pedicle probe used to probe the pedicle channel, checked with a ball-tip probe and verify no broaching of cortex. Pedicle probe of L3 noted on C-arm fluoroscopy to be in the correct degree of lordosis centered within the pedicle and a length of about 45mm screw. Tapping was then performed using is 5  mm tap again checking the opening within the pedicle with a pedicle probe to ensure no broaching cortex. 6mm x 45 mm pedicle screw Depuy Expedeium type was then inserted into the right L3 pedicle after first decorticating the transverse process of L3. The bone marrow charged Chronos sponge was then inserted between the transverse process of L3 on the right side to the upper aspect of the posterior transverse process of L4 . L3 Pedicle screw inserted in excellent position alignment observed with  C-arm fluoroscopy. Attention then turned to the right L4 pedicle screw insertion,  the superior articular process L4 was debrided and resected. A previous entry point into the L4 pedicle identified using the ball-tipped probe.Converging medially C-arm used to verify the alignment for the L4  pedicle. The pedicle was then probed to a depth of 45 mm using the  ball-tip  probe. Decortication of the  L4 transverse  process was carried out using a high-speed bur and bone graft applied. Check made with ball-tip probe to ensure no penetration of the cortex of the L4  pedicle. A 7 mm x 45 and mm expedieum screw was then inserted degree of lordosis and in the correct degree of convergence. The C-arm was used to verify the position alignment of pedicle screw. Attention then turned to the right L5 pedicle screw insertion,  the superior articular process was debrided and resected. A previous entry point into the L5 pedicle identified using the ball-tipped probe.Converging medially C-arm used to verify the alignment for the L5 pedicle. The pedicle was then probed to a depth of 45 mm using the  ball-tip  probe. Decortication of the  L5 transverse process was carried out using a high-speed bur and bone graft applied. Check made with ball-tip probe to ensure no penetration of the cortex of the L5 pedicle. A 7 mm x 45 and mm expedieum screw was then inserted degree of lordosis and in the correct degree of convergence. The C-arm was used to verify the position alignment of pedicle screw.  Attention then turned to the right S1 pedicle screw placement,  the superior articular process was debrided and resected. A previous entry point into the S1 pedicle identified using the ball-tipped probe.Converging medially C-arm used to verify the alignment for the S1 pedicle. The pedicle was then probed to a depth of 45 mm using the  ball-tip  probe. Decortication of the  S1 sacral ala was carried out using a high-speed bur and bone graft applied.  Check made with ball-tip probe to ensure no penetration of the cortex of the S1 pedicle. Portions of bone graft were then placed into the previous opening for the S1 pedicles that had been opened to allow for removal  of broken screws at this level . A 7 mm x 45 and mm expedieum screw was then inserted degree of lordosis and in the correct degree of convergence. The C-arm was used to verify the position alignment of pedicle screw. A 95 mm precontoured rod was then carefully inserted into the fasteners extending from L3-S1 on the rightt side. The fastener caps at L 3, L4, L5 and S1 level were then placed and the L3 fastener cap torqued to 80 foot-pounds. Compression between the fasteners L3 and L4 was then performed and the cap at the L4 level was tightened to 80 foot pounds. The remaining fastener caps at L5 and S1 were then tightened to 80 foot-pounds in neutral compression. Irrigation was carried out using copious amounts of irrigant solution. Thrombin-soaked Gelfoam were placed for hemostasis was then carefully removed. Permanent C-arm images were obtained in AP, oblique and lateral positions for documentation purposes. They showed the pedicle screws rods to be in good position alignment from L3-S1  Cell Saver was used during this case however there 1500 cc blood loss and a total of 785 cc of Cell Saver blood was returned.  A medium Hemovac drain was placed in the left lower lumbar spine and the midline incision away from neural structures the deep paralumbar muscles with her approximated with 1 Vicryl sutures, the lumbodorsal fascia approximated with 0 and 1 Vicryl sutures. Subcutaneous layers were then approximated with interrupted 0 and 2-0 Vicryl sutures , skin closed with a running subcuticular 4-0 Vicryl suture. The dressing was then applied. Mepilex bandage was then applied. The exiting Hemovac drain site and carefully bandage. All instrument and sponge counts were correct. Patient was then  returned to  the supine position reactivated extubated and returned to the recovery room in satisfactory condition  NITKA,JAMES E 06/29/2012, 8:03 PM

## 2012-06-29 NOTE — Preoperative (Signed)
Beta Blockers   Reason not to administer Beta Blockers:Not Applicable 

## 2012-06-30 LAB — GLUCOSE, CAPILLARY
Glucose-Capillary: 155 mg/dL — ABNORMAL HIGH (ref 70–99)
Glucose-Capillary: 181 mg/dL — ABNORMAL HIGH (ref 70–99)
Glucose-Capillary: 193 mg/dL — ABNORMAL HIGH (ref 70–99)
Glucose-Capillary: 238 mg/dL — ABNORMAL HIGH (ref 70–99)

## 2012-06-30 LAB — BASIC METABOLIC PANEL
CO2: 29 mEq/L (ref 19–32)
Calcium: 8.3 mg/dL — ABNORMAL LOW (ref 8.4–10.5)
Glucose, Bld: 169 mg/dL — ABNORMAL HIGH (ref 70–99)
Sodium: 136 mEq/L (ref 135–145)

## 2012-06-30 LAB — CBC
Hemoglobin: 11.2 g/dL — ABNORMAL LOW (ref 13.0–17.0)
MCH: 27.5 pg (ref 26.0–34.0)
RBC: 4.08 MIL/uL — ABNORMAL LOW (ref 4.22–5.81)

## 2012-06-30 MED ORDER — CHLORHEXIDINE GLUCONATE 0.12 % MT SOLN
15.0000 mL | Freq: Two times a day (BID) | OROMUCOSAL | Status: DC
  Administered 2012-06-30 – 2012-07-02 (×6): 15 mL via OROMUCOSAL
  Filled 2012-06-30 (×6): qty 15

## 2012-06-30 MED ORDER — BIOTENE DRY MOUTH MT LIQD
15.0000 mL | Freq: Two times a day (BID) | OROMUCOSAL | Status: DC
  Administered 2012-06-30 – 2012-07-01 (×4): 15 mL via OROMUCOSAL

## 2012-06-30 MED ORDER — NICOTINE 14 MG/24HR TD PT24
14.0000 mg | MEDICATED_PATCH | Freq: Every day | TRANSDERMAL | Status: DC | PRN
  Filled 2012-06-30: qty 1

## 2012-06-30 MED ORDER — OXYCODONE HCL 5 MG PO TABS
10.0000 mg | ORAL_TABLET | ORAL | Status: DC | PRN
  Administered 2012-06-30 – 2012-07-01 (×4): 10 mg via ORAL
  Filled 2012-06-30: qty 2
  Filled 2012-06-30 (×2): qty 1
  Filled 2012-06-30 (×2): qty 2

## 2012-06-30 MED ORDER — OXYCODONE HCL 10 MG PO TB12
20.0000 mg | ORAL_TABLET | Freq: Two times a day (BID) | ORAL | Status: DC
  Administered 2012-06-30 (×2): 20 mg via ORAL
  Filled 2012-06-30 (×2): qty 2

## 2012-06-30 NOTE — Progress Notes (Signed)
Orthopedic Tech Progress Note Patient Details:  Derrick Hinton [birth year] [id number]  Patient ID: Derrick Hinton, male   DOB: Jul 21, 1961, 51 y.o.   MRN: [id number]   [name] 06/30/2012, 9:40 AM COMPLETED BY BIO TECH LUMBAR FUSION BRACE

## 2012-06-30 NOTE — Progress Notes (Signed)
Subjective: 1 Day Post-Op Procedure(s) (LRB): POSTERIOR LUMBAR FUSION 3 WITH HARDWARE REMOVAL (N/A) Patient reports pain as moderate.   Using PCA analgesics. No nausea.  Min flatus Objective: Vital signs in last 24 hours: Temp:  [97 F (36.1 C)-98.1 F (36.7 C)] 98.1 F (36.7 C) (08/28 0610) Pulse Rate:  [66-96] 84  (08/28 0610) Resp:  [7-20] 13  (08/28 0824) BP: (114-146)/(63-99) 116/75 mmHg (08/28 0610) SpO2:  [90 %-100 %] 92 % (08/28 0824) Arterial Line BP: (121-147)/(52-79) 122/55 mmHg (08/27 2103) FiO2 (%):  [3 %-10 %] 4 % (08/27 2100) Estimated Body mass index is 35.16 kg/(m^2) as calculated from the following:   Height as of this encounter: 5\' 9" (1.753 m).   Weight as of 06/09/12: 238 lb 1.3 oz(107.992 kg).  Intake/Output from previous day: 08/27 0701 - 08/28 0700 In: 7356.7 [I.V.:6271.7; Blood:785; IV Piggyback:300] Out: 3030 [Urine:1080; Drains:450; Blood:1500] Intake/Output this shift:     Basename 06/30/12 0500  HGB 11.2*    Basename 06/30/12 0500  WBC 11.9*  RBC 4.08*  HCT 35.2*  PLT 131*    Basename 06/30/12 0500  NA 136  K 4.4  CL 102  CO2 29  BUN 12  CREATININE 0.84  GLUCOSE 169*  CALCIUM 8.3*   No results found for this basename: LABPT:2,INR:2 in the last 72 hours  Neurovascular intact Intact pulses distally Dorsiflexion/Plantar flexion intact Incision: dressing C/D/I hemovac drain removed without difficulty Assessment/Plan: 1 Day Post-Op Procedure(s) (LRB): POSTERIOR LUMBAR FUSION 3 WITH HARDWARE REMOVAL (N/A) Up with therapy Continue PCA and wean to po meds today.  Will begin Oxycontin 20mg  po q12 with oxy ir for breakthru pain. Acute blood loss anemia:  Will monitor, thus far asymptomatic.  H&H in am Dressing change in am and daily.  Alayza Pieper M 06/30/2012, 8:35 AM

## 2012-06-30 NOTE — Evaluation (Signed)
Physical Therapy Evaluation Patient Details Name: Derrick Hinton MRN: [id number] DOB: 09/13/1961 Today's Date: 06/30/2012 Time: 5956-3875 PT Time Calculation (min): 34 min  PT Assessment / Plan / Recommendation Clinical Impression  Pt s/p redo lumbar fusion with removal hardware L4-S1 and L3-S1 fusion. Pt will benefit from PT to incr independence and safety with mobility while adhering to back precautions.    PT Assessment  Patient needs continued PT services    Follow Up Recommendations  Home health PT;Supervision for mobility/OOB    Barriers to Discharge None      Equipment Recommendations  None recommended by PT    Recommendations for Other Services OT consult   Frequency Min 5X/week    Precautions / Restrictions Precautions Precautions: Back Precaution Booklet Issued: Yes (comment) Required Braces or Orthoses: Spinal Brace Spinal Brace: Lumbar corset;Applied in sitting position   Pertinent Vitals/Pain 7/10 back pain; pt using PCA x 2 during session SaO2 96% on 2L; decr to 82 % even with cues for deep breaths while on RA during OOB; [place] O2 resumed once in chair with SaO2 92%      Mobility  Bed Mobility Bed Mobility: Rolling Right;Right Sidelying to Sit;Sitting - Scoot to Edge of Bed Rolling Right: 4: Min guard;With rail Right Sidelying to Sit: 4: Min assist;HOB flat;With rails Sitting - Scoot to Edge of Bed: 5: Supervision Details for Bed Mobility Assistance: vc and tactile cues for technique and to maintain back precautions; assist to elevate trunk without twisting Transfers Transfers: Sit to Stand;Stand to Sit Sit to Stand: 4: Min guard;With upper extremity assist;From elevated surface;From bed Stand to Sit: 4: Min guard;With upper extremity assist;With armrests;To chair/3-in-1 Details for Transfer Assistance: vc for safe use of RW/hand placement; vc for technique Ambulation/Gait Ambulation/Gait Assistance: 4: Min guard Ambulation Distance (Feet): 5  Feet Assistive device: Rolling walker Ambulation/Gait Assistance Details: limited by decr SaO2 with activity (on RA, with [place] O2 resumed once in chair) Gait Pattern: Step-through pattern;Decreased stride length    Exercises     PT Diagnosis: Difficulty walking;Acute pain  PT Problem List: Decreased activity tolerance;Decreased mobility;Decreased knowledge of use of DME;Decreased knowledge of precautions;Cardiopulmonary status limiting activity;Pain PT Treatment Interventions: DME instruction;Gait training;Stair training;Functional mobility training;Therapeutic activities;Patient/family education   PT Goals Acute Rehab PT Goals PT Goal Formulation: With patient Time For Goal Achievement: 07/07/12 Potential to Achieve Goals: Good Pt will Roll Supine to Right Side: with modified independence PT Goal: Rolling Supine to Right Side - Progress: Goal set today Pt will go Supine/Side to Sit: with supervision;with HOB 0 degrees PT Goal: Supine/Side to Sit - Progress: Goal set today Pt will go Sit to Supine/Side: with modified independence;with HOB 0 degrees PT Goal: Sit to Supine/Side - Progress: Goal set today Pt will go Sit to Stand: with supervision;with upper extremity assist PT Goal: Sit to Stand - Progress: Goal set today Pt will go Stand to Sit: with supervision;with upper extremity assist PT Goal: Stand to Sit - Progress: Goal set today Pt will Ambulate: >150 feet;with supervision;with least restrictive assistive device PT Goal: Ambulate - Progress: Goal set today Pt will Go Up / Down Stairs: 3-5 stairs;with rail(s);with min assist PT Goal: Up/Down Stairs - Progress: Goal set today Additional Goals Additional Goal #1: Pt will verbalize and adhere to all back precautions with activity. PT Goal: Additional Goal #1 - Progress: Goal set today  Visit Information  Last PT Received On: 06/30/12 Assistance Needed: +1    Subjective Data  Subjective: Reports his wife  can provide assist  (physically) if needed Patient Stated Goal: return home with less pain   Prior Functioning  Home Living Lives With: Spouse Available Help at Discharge: Family;Available 24 hours/day Type of Home: House Home Access: Stairs to enter [employer] of Steps: 3 Entrance Stairs-Rails: Right Home Layout: One level Bathroom Shower/Tub: Forensic scientist: Standard Bathroom Accessibility: Yes How Accessible: Accessible via walker Home Adaptive Equipment: Dan Humphreys - four wheeled;Walker - rolling;Other (comment) (? has BSC--pt unsure) Prior Function Level of Independence: Independent Able to Take Stairs?: Reciprically Driving: Yes Vocation: On disability Communication Communication: No difficulties Dominant Hand: Right    Cognition  Overall Cognitive Status: Appears within functional limits for tasks assessed/performed Arousal/Alertness: Awake/alert Orientation Level: Appears intact for tasks assessed Behavior During Session: [facility] for tasks performed    Extremity/Trunk Assessment Right Lower Extremity Assessment RLE ROM/Strength/Tone: Within functional levels RLE Sensation: WFL - Light Touch RLE Coordination: WFL - gross motor Left Lower Extremity Assessment LLE ROM/Strength/Tone: Within functional levels LLE Sensation: WFL - Light Touch LLE Coordination: WFL - gross motor Trunk Assessment Trunk Assessment: Normal   Balance    End of Session PT - End of Session Equipment Utilized During Treatment: Back brace Activity Tolerance: Treatment limited secondary to medical complications (Comment) (decr SaO2) Patient left: in chair;with call bell/phone within reach;with family/visitor present Nurse Communication: Mobility status;Precautions (spoke with nurse tech and nurse)  GP     Chanteria Haggard 06/30/2012, 10:13 AM  Pager [phone]

## 2012-06-30 NOTE — Progress Notes (Signed)
UR COMPLETED  

## 2012-06-30 NOTE — Progress Notes (Signed)
Clinical Social Work  CSW received inappropriate referral for SNF placement. Per chart review, PT recommending HH. CM aware of HH needs. CSW is signing off but available if needed.   [place], [place] [phone]

## 2012-06-30 NOTE — Care Management Note (Signed)
  Page 2 of 2   06/30/2012     4:09:05 PM   CARE MANAGEMENT NOTE 06/30/2012  Patient:  Derrick Hinton, Derrick Hinton   Account Number:  [id number]  Date Initiated:  06/30/2012  Documentation initiated by:  Ronny Flurry  Subjective/Objective Assessment:     Action/Plan:   Anticipated DC Date:  07/02/2012   Anticipated DC Plan:  HOME W HOME HEALTH SERVICES      DC Planning Services  CM consult      Choice offered to / List presented to:     DME arranged  3-N-1      DME agency  [facility]     HH arranged  HH-2 PT  HH-3 OT      [facility] agency  [facility]   Status of service:  Completed, signed off Medicare Important Message given?   (If response is "NO", the following Medicare IM given date fields will be blank) Date Medicare IM given:   Date Additional Medicare IM given:    Discharge Disposition:    Per UR Regulation:  Reviewed for med. necessity/level of care/duration of stay  If discussed at Long Length of Stay Meetings, dates discussed:    Comments:  06-30-12 Patient has rolling walker at home , would like 3 in 1 . Explained to patient due to his insurance will have to order 3 in 1 through [employer] [phone] and someone will have to pick up the equipment Christoper Allegra does not deliver ) , family member in member stated that was ok.  Phone number in EPIC is correct.  However, patient stated address is : [address] , [place], [address].  Gave patient list of home health agencies for [facility].  Will check be with patient later.  Ronny Flurry RN BSN [phone]

## 2012-06-30 NOTE — Evaluation (Signed)
Occupational Therapy Evaluation Patient Details Name: HANDY MCLOUD MRN: [id number] DOB: [birth year] Today's Date: 06/30/2012 Time: 7829-5621 OT Time Calculation (min): 49 min  OT Assessment / Plan / Recommendation Clinical Impression  This 51 y.o. male admitted for lumbar fusion.  Pt. with pain 9/10 upon therapist's enterance.  Pt. was agreeable to try OOB activity resulting in pain decreasing to 6/10.  Pt. moves slowly, and currently requires mod - max A for ADLs due to pain.  Pt. will benefit from AE instruction.  Pt. family is very supportive.  Pt. will benefit from OT to maximize safety and independence with BADLs to allow him to return home at min A level.      OT Assessment  Patient needs continued OT Services    Follow Up Recommendations  No OT follow up    Barriers to Discharge None    Equipment Recommendations  3 in 1 bedside comode (D)    Recommendations for Other Services    Frequency  Min 2X/week    Precautions / Restrictions Precautions Precautions: Back Precaution Booklet Issued: Yes (comment) Required Braces or Orthoses: Spinal Brace Spinal Brace: Lumbar corset;Applied in sitting position Restrictions Weight Bearing Restrictions: No       ADL  Grooming: Simulated;Wash/dry hands;Wash/dry face;Minimal assistance Where Assessed - Grooming: Supported standing Upper Body Bathing: Simulated;Moderate assistance Where Assessed - Upper Body Bathing: Unsupported sitting Lower Body Bathing: Simulated;Maximal assistance Where Assessed - Lower Body Bathing: Supported sit to stand Upper Body Dressing: Simulated;Moderate assistance Where Assessed - Upper Body Dressing: Unsupported sitting Lower Body Dressing: +1 Total assistance;Simulated Where Assessed - Lower Body Dressing: Supported sit to stand Toilet Transfer: Mining engineer Method: Sit to Barista: Comfort height toilet Toileting - Clothing Manipulation  and Hygiene: Simulated;Moderate assistance Where Assessed - Toileting Clothing Manipulation and Hygiene: Standing Equipment Used: Back brace;Rolling walker Transfers/Ambulation Related to ADLs: Min A and min cues for hand placement with sit to stand and to avoid twisting when turning ADL Comments: Pt. unable to access feet by crossing ankles over knees    OT Diagnosis: Generalized weakness;Acute pain  OT Problem List: Decreased strength;Decreased activity tolerance;Decreased knowledge of use of DME or AE;Pain;Decreased knowledge of precautions OT Treatment Interventions:     OT Goals Acute Rehab OT Goals OT Goal Formulation: With patient Time For Goal Achievement: 07/07/12 Potential to Achieve Goals: Good ADL Goals Pt Will Perform Grooming: with supervision;Supported;Standing at sink ADL Goal: Grooming - Progress: Goal set today Pt Will Perform Lower Body Bathing: with supervision;Sit to stand from bed;Sit to stand from chair;Sit to stand in shower (with AE) ADL Goal: Lower Body Bathing - Progress: Goal set today Pt Will Perform Lower Body Dressing: with supervision;with adaptive equipment;Sit to stand from bed;Sit to stand from chair ADL Goal: Lower Body Dressing - Progress: Goal set today Pt Will Transfer to Toilet: with supervision;Comfort height toilet;3-in-1;Ambulation ADL Goal: Toilet Transfer - Progress: Goal set today Pt Will Perform Toileting - Clothing Manipulation: with supervision;Standing ADL Goal: Toileting - Clothing Manipulation - Progress: Goal set today Pt Will Perform Toileting - Hygiene: with supervision;Leaning right and/or left on 3-in-1/toilet;Sit to stand from 3-in-1/toilet ADL Goal: Toileting - Hygiene - Progress: Goal set today Pt Will Perform Tub/Shower Transfer: Ambulation;with DME;Tub transfer (min guard assist) ADL Goal: Tub/Shower Transfer - Progress: Goal set today Additional ADL Goal #1: Pt/family will be independent with donning brace ADL Goal:  Additional Goal #1 - Progress: Goal set today  Visit Information  Last OT Received  On: 06/30/12 Assistance Needed: +1    Subjective Data  Subjective: "Oh, I'm hurting" Patient Stated Goal: To get better   Prior Functioning  Vision/Perception  Home Living Lives With: Spouse Available Help at Discharge: Family;Available 24 hours/day Type of Home: House Home Access: Stairs to enter [employer] of Steps: 3 Entrance Stairs-Rails: Right Home Layout: One level Bathroom Shower/Tub: Forensic scientist: Standard Bathroom Accessibility: Yes How Accessible: Accessible via walker Home Adaptive Equipment: Dan Humphreys - four wheeled;Walker - rolling;Other (comment) Prior Function Level of Independence: Independent Able to Take Stairs?: Reciprically Driving: Yes Vocation: On disability Communication Communication: No difficulties Dominant Hand: Right      Cognition  Overall Cognitive Status: Appears within functional limits for tasks assessed/performed Arousal/Alertness: Awake/alert Orientation Level: Appears intact for tasks assessed Behavior During Session: [facility] for tasks performed    Extremity/Trunk Assessment Right Upper Extremity Assessment RUE ROM/Strength/Tone: Within functional levels RUE Coordination: WFL - gross/fine motor Left Upper Extremity Assessment LUE ROM/Strength/Tone: Within functional levels LUE Coordination: WFL - gross/fine motor   Mobility  Shoulder Instructions  Bed Mobility Bed Mobility: Rolling Right;Right Sidelying to Sit;Sitting - Scoot to Edge of Bed Rolling Right: 4: Min guard;With rail Right Sidelying to Sit: 4: Min assist;HOB flat;With rails Sitting - Scoot to Edge of Bed: 5: Supervision Details for Bed Mobility Assistance: min vc for proper technique Transfers Transfers: Sit to Stand;Stand to Sit Sit to Stand: 4: Min guard;With upper extremity assist;From elevated surface;From bed Stand to Sit: 4: Min guard;With  upper extremity assist;With armrests;To chair/3-in-1       Exercise     Balance     End of Session OT - End of Session Equipment Utilized During Treatment: Back brace Activity Tolerance: Patient limited by pain Patient left: in chair;with call bell/phone within reach;with family/visitor present;with nursing in room Nurse Communication: Patient requests pain meds  GO     Rebeca Valdivia, Ursula Alert M 06/30/2012, 3:20 PM

## 2012-07-01 DIAGNOSIS — K567 Ileus, unspecified: Secondary | ICD-10-CM | POA: Diagnosis not present

## 2012-07-01 LAB — BASIC METABOLIC PANEL
BUN: 6 mg/dL (ref 6–23)
CO2: 27 mEq/L (ref 19–32)
Glucose, Bld: 234 mg/dL — ABNORMAL HIGH (ref 70–99)
Potassium: 4.5 mEq/L (ref 3.5–5.1)
Sodium: 137 mEq/L (ref 135–145)

## 2012-07-01 LAB — GLUCOSE, CAPILLARY
Glucose-Capillary: 207 mg/dL — ABNORMAL HIGH (ref 70–99)
Glucose-Capillary: 192 mg/dL — ABNORMAL HIGH (ref 70–99)
Glucose-Capillary: 203 mg/dL — ABNORMAL HIGH (ref 70–99)

## 2012-07-01 MED ORDER — MAGNESIUM CITRATE PO SOLN
1.0000 | Freq: Once | ORAL | Status: AC
  Administered 2012-07-01: 1 via ORAL
  Filled 2012-07-01: qty 296

## 2012-07-01 MED ORDER — AMLODIPINE BESYLATE 10 MG PO TABS
10.0000 mg | ORAL_TABLET | Freq: Every day | ORAL | Status: DC
  Administered 2012-07-01 – 2012-07-02 (×2): 10 mg via ORAL
  Filled 2012-07-01 (×4): qty 1

## 2012-07-01 MED ORDER — CLONIDINE HCL 0.3 MG PO TABS
0.4500 mg | ORAL_TABLET | Freq: Every day | ORAL | Status: DC
  Administered 2012-07-01 – 2012-07-03 (×3): 0.45 mg via ORAL
  Filled 2012-07-01 (×3): qty 1.5

## 2012-07-01 MED ORDER — BENAZEPRIL HCL 40 MG PO TABS
40.0000 mg | ORAL_TABLET | Freq: Every day | ORAL | Status: DC
  Administered 2012-07-01 – 2012-07-03 (×3): 40 mg via ORAL
  Filled 2012-07-01 (×3): qty 1

## 2012-07-01 MED ORDER — IRBESARTAN 300 MG PO TABS
300.0000 mg | ORAL_TABLET | Freq: Every day | ORAL | Status: DC
  Administered 2012-07-01 – 2012-07-03 (×3): 300 mg via ORAL
  Filled 2012-07-01 (×3): qty 1

## 2012-07-01 MED ORDER — BISACODYL 10 MG RE SUPP
10.0000 mg | Freq: Every day | RECTAL | Status: DC | PRN
  Administered 2012-07-01: 10 mg via RECTAL
  Filled 2012-07-01: qty 1

## 2012-07-01 MED ORDER — HYDROCHLOROTHIAZIDE 25 MG PO TABS
25.0000 mg | ORAL_TABLET | Freq: Every day | ORAL | Status: DC
  Administered 2012-07-01 – 2012-07-03 (×3): 25 mg via ORAL
  Filled 2012-07-01 (×5): qty 1

## 2012-07-01 MED ORDER — OXYCODONE HCL 10 MG PO TB12
20.0000 mg | ORAL_TABLET | Freq: Three times a day (TID) | ORAL | Status: DC
Start: 2012-07-01 — End: 2012-07-03
  Administered 2012-07-01 – 2012-07-03 (×6): 20 mg via ORAL
  Filled 2012-07-01: qty 2
  Filled 2012-07-01: qty 1
  Filled 2012-07-01 (×4): qty 2

## 2012-07-01 MED ORDER — CLONIDINE HCL 0.3 MG PO TABS: 0.3000 mg | ORAL_TABLET | Freq: Every day | ORAL | Status: DC

## 2012-07-01 MED ORDER — AMLODIPINE BESYLATE 5 MG PO TABS
5.0000 mg | ORAL_TABLET | Freq: Every day | ORAL | Status: DC
  Filled 2012-07-01: qty 1

## 2012-07-01 MED ORDER — CLONIDINE HCL 0.3 MG PO TABS
0.6000 mg | ORAL_TABLET | Freq: Every day | ORAL | Status: DC
  Administered 2012-07-01 – 2012-07-02 (×2): 0.6 mg via ORAL
  Filled 2012-07-01 (×4): qty 2

## 2012-07-01 MED ORDER — GABAPENTIN 300 MG PO CAPS
300.0000 mg | ORAL_CAPSULE | Freq: Four times a day (QID) | ORAL | Status: DC
  Administered 2012-07-01 – 2012-07-03 (×7): 300 mg via ORAL
  Filled 2012-07-01 (×11): qty 1

## 2012-07-01 MED ORDER — OXYCODONE HCL 5 MG PO TABS
15.0000 mg | ORAL_TABLET | ORAL | Status: DC | PRN
  Administered 2012-07-03: 15 mg via ORAL
  Filled 2012-07-01: qty 3

## 2012-07-01 MED ORDER — METOCLOPRAMIDE HCL 5 MG/ML IJ SOLN
10.0000 mg | Freq: Three times a day (TID) | INTRAMUSCULAR | Status: AC
  Administered 2012-07-01 – 2012-07-02 (×3): 10 mg via INTRAVENOUS
  Filled 2012-07-01 (×3): qty 2

## 2012-07-01 MED ORDER — LORAZEPAM 0.5 MG PO TABS: 0.5000 mg | ORAL_TABLET | Freq: Four times a day (QID) | ORAL | Status: DC | PRN

## 2012-07-01 NOTE — Progress Notes (Signed)
Patient ID: Derrick Hinton, male   DOB: 08-Jul-1961, 51 y.o.   MRN: [id number] Abdomen still distend but soft and nontender, no flatus, decreased bowel sounds. Incision dry. Standing and walking to the bathroom with assistance. Legs N-V normal. When bowel function resumes will advance his diet and resume oral antihyperglygemic meds. PT/OT to continue. And will likely need home health for PT/OT at discharge.

## 2012-07-01 NOTE — Progress Notes (Signed)
Physical Therapy Treatment Patient Details Name: Derrick Hinton MRN: [id number] DOB: 1961-09-30 Today's Date: 07/01/2012 Time: 0952-1010 PT Time Calculation (min): 18 min  PT Assessment / Plan / Recommendation Comments on Treatment Session  Patient progressing well with ambulation. Patient states that he has walked with nursing several times. Patient educated on brace wear and he was able to recall precautions    Follow Up Recommendations  Home health PT;Supervision for mobility/OOB    Barriers to Discharge        Equipment Recommendations  3 in 1 bedside comode    Recommendations for Other Services    Frequency Min 5X/week   Plan Discharge plan remains appropriate;Frequency remains appropriate    Precautions / Restrictions Precautions Precautions: Back Required Braces or Orthoses: Spinal Brace Spinal Brace: Lumbar corset;Applied in sitting position Restrictions Weight Bearing Restrictions: No   Pertinent Vitals/Pain     Mobility  Bed Mobility Bed Mobility: Not assessed Transfers Sit to Stand: 4: Min guard;With upper extremity assist;With armrests;From chair/3-in-1 Stand to Sit: 4: Min guard;With upper extremity assist;With armrests;To chair/3-in-1 Details for Transfer Assistance: Cues not to arch too much with stand and for safe hand placement Ambulation/Gait Ambulation/Gait Assistance: 4: Min guard Ambulation Distance (Feet): 125 Feet Assistive device: Rolling walker Ambulation/Gait Assistance Details: Cues for upright posture Gait Pattern: Step-through pattern;Decreased stride length;Trunk flexed    Exercises     PT Diagnosis:    PT Problem List:   PT Treatment Interventions:     PT Goals Acute Rehab PT Goals PT Goal: Sit to Stand - Progress: Progressing toward goal PT Goal: Stand to Sit - Progress: Progressing toward goal PT Goal: Ambulate - Progress: Progressing toward goal Additional Goals PT Goal: Additional Goal #1 - Progress: Progressing toward  goal  Visit Information  Last PT Received On: 07/01/12 Assistance Needed: +1    Subjective Data      Cognition  Overall Cognitive Status: Appears within functional limits for tasks assessed/performed Arousal/Alertness: Awake/alert Orientation Level: Appears intact for tasks assessed Behavior During Session: [facility] for tasks performed    Balance     End of Session PT - End of Session Equipment Utilized During Treatment: Gait belt;Back brace Activity Tolerance: Patient tolerated treatment well Patient left: in chair;with call bell/phone within reach Nurse Communication: Mobility status   GP     Fredrich Birks 07/01/2012, 11:45 AM 07/01/2012 Fredrich Birks PTA [phone] pager [phone] office

## 2012-07-01 NOTE — Progress Notes (Signed)
Patient examined and lab reviewed with Vernon PA-C. 

## 2012-07-01 NOTE — Progress Notes (Signed)
Dr. August Saucer paged at 0530 about patient's BP in the 160/90 ranges throughout the night. He ordered the patient to be restarted on his home doses of his blood pressure medications at this time.

## 2012-07-01 NOTE — Progress Notes (Signed)
Subjective: 2 Days Post-Op Procedure(s) (LRB): POSTERIOR LUMBAR FUSION 3 WITH HARDWARE REMOVAL (N/A) Patient reports pain as 10 on 0-10 scale and severe.  Mostly back pain.  Some aching in anterior thighs No nausea.  Denies flatus or BM. Discussed pain meds with RN , pt getting large amts of Morphine thru the night.  OXY IR and Oxycontin started yesterday. Pt anxious about poor pain control Objective: Vital signs in last 24 hours: Temp:  [98.4 F (36.9 C)-99.3 F (37.4 C)] 98.6 F (37 C) (08/29 0610) Pulse Rate:  [96-121] 110  (08/29 0610) Resp:  [10-20] 14  (08/29 0800) BP: (146-170)/(80-98) 164/85 mmHg (08/29 0610) SpO2:  [90 %-96 %] 93 % (08/29 0800)  Intake/Output from previous day: 08/28 0701 - 08/29 0700 In: 2620 [I.V.:2560; IV Piggyback:60] Out: 3150 [Urine:3150] Intake/Output this shift: Total I/O In: -  Out: 500 [Urine:500]   Basename 06/30/12 0500  HGB 11.2*    Basename 06/30/12 0500  WBC 11.9*  RBC 4.08*  HCT 35.2*  PLT 131*    Basename 06/30/12 0500  NA 136  K 4.4  CL 102  CO2 29  BUN 12  CREATININE 0.84  GLUCOSE 169*  CALCIUM 8.3*   No results found for this basename: LABPT:2,INR:2 in the last 72 hours  Neurovascular intact Sensation intact distally Intact pulses distally Dorsiflexion/Plantar flexion intact Incision: no drainage abdomen distended, tympanic and bowel sounds hypoactive  Assessment/Plan: 2 Days Post-Op Procedure(s) (LRB): POSTERIOR LUMBAR FUSION 3 WITH HARDWARE REMOVAL (N/A) Up with therapy Post op Ileus:  Cont IVF, NPO, DC Morphine PCA and use oral meds.  Dulcolax and mag citrate today. Check bmet and H/H Anxiety:  Ativan prn. Acute blood loss anemia:  Will check H/H today.  Currently asymptomatic.  Derrick Hinton M 07/01/2012, 8:42 AM

## 2012-07-02 LAB — GLUCOSE, CAPILLARY
Glucose-Capillary: 149 mg/dL — ABNORMAL HIGH (ref 70–99)
Glucose-Capillary: 161 mg/dL — ABNORMAL HIGH (ref 70–99)
Glucose-Capillary: 219 mg/dL — ABNORMAL HIGH (ref 70–99)
Glucose-Capillary: 223 mg/dL — ABNORMAL HIGH (ref 70–99)

## 2012-07-02 MED ORDER — SODIUM CHLORIDE 0.9 % IV SOLN
INTRAVENOUS | Status: DC
  Administered 2012-07-02: 15:00:00 via INTRAVENOUS

## 2012-07-02 MED ORDER — OXYCODONE HCL 20 MG PO TB12: 20.0000 mg | ORAL_TABLET | Freq: Two times a day (BID) | ORAL | Status: DC | PRN

## 2012-07-02 MED ORDER — SENNOSIDES-DOCUSATE SODIUM 8.6-50 MG PO TABS
1.0000 | ORAL_TABLET | Freq: Every day | ORAL | Status: DC
  Administered 2012-07-02: 1 via ORAL
  Filled 2012-07-02: qty 1

## 2012-07-02 MED ORDER — INSULIN ASPART 100 UNIT/ML ~~LOC~~ SOLN
0.0000 [IU] | Freq: Three times a day (TID) | SUBCUTANEOUS | Status: DC
  Administered 2012-07-03: 5 [IU] via SUBCUTANEOUS

## 2012-07-02 MED ORDER — OXYCODONE HCL 5 MG PO TABS: 5.0000 mg | ORAL_TABLET | ORAL | Status: DC | PRN

## 2012-07-02 MED ORDER — METHOCARBAMOL 500 MG PO TABS: 500.0000 mg | ORAL_TABLET | Freq: Four times a day (QID) | ORAL | Status: AC | PRN

## 2012-07-02 MED FILL — Heparin Sodium (Porcine) Inj 1000 Unit/ML: INTRAMUSCULAR | Qty: 30 | Status: AC

## 2012-07-02 MED FILL — Sodium Chloride IV Soln 0.9%: INTRAVENOUS | Qty: 1000 | Status: AC

## 2012-07-02 MED FILL — Sodium Chloride Irrigation Soln 0.9%: Qty: 3000 | Status: AC

## 2012-07-02 NOTE — Progress Notes (Signed)
Subjective: 3 Days Post-Op Procedure(s) (LRB): POSTERIOR LUMBAR FUSION 3 WITH HARDWARE REMOVAL (N/A) Patient reports pain as mild.  Much improved today. In bathroom when I came to see him. No bm but having lots of flatus.  States he has chronic constipation and has to use stool softeners and laxatives at home routinely States he feels "rumbling in his stomach" Improving with activity  Objective: Vital signs in last 24 hours: Temp:  [97.9 F (36.6 C)-98.2 F (36.8 C)] 98.2 F (36.8 C) (08/30 1004) Pulse Rate:  [69-103] 100  (08/30 1004) Resp:  [18-20] 19  (08/30 1004) BP: (105-151)/(57-85) 115/66 mmHg (08/30 1004) SpO2:  [93 %-98 %] 95 % (08/30 1004)  Intake/Output from previous day: 08/29 0701 - 08/30 0700 In: 2679 [P.O.:540; I.V.:2139] Out: 2100 [Urine:2100] Intake/Output this shift:     Basename 07/01/12 1012 06/30/12 0500  HGB 12.4* 11.2*    Basename 07/01/12 1012 06/30/12 0500  WBC -- 11.9*  RBC -- 4.08*  HCT 37.7* 35.2*  PLT -- 131*    Basename 07/01/12 1012 06/30/12 0500  NA 137 136  K 4.5 4.4  CL 98 102  CO2 27 29  BUN 6 12  CREATININE 0.66 0.84  GLUCOSE 234* 169*  CALCIUM 9.2 8.3*   No results found for this basename: LABPT:2,INR:2 in the last 72 hours  Neurovascular intact Sensation intact distally Intact pulses distally Dorsiflexion/Plantar flexion intact Incision: no drainage abdomen with less distention today.  +bowel sounds  Assessment/Plan: 3 Days Post-Op Procedure(s) (LRB): POSTERIOR LUMBAR FUSION 3 WITH HARDWARE REMOVAL (N/A) Advance diet D/C IV fluids Plan for discharge tomorrow Ileus resolving, however will need to continue on stimulants while he is on narcotics.  Will advance diet slowly and give [place] S at bedtime.  If continues to improve will dc in am.  rx on chart.  OV 2 weeks. ABL anemia : stable Cristan Scherzer M 07/02/2012, 1:57 PM

## 2012-07-02 NOTE — Progress Notes (Signed)
Physical Therapy Treatment Patient Details Name: Derrick Hinton MRN: [id number] DOB: 11-Aug-1961 Today's Date: 07/02/2012 Time: 7829-5621 PT Time Calculation (min): 24 min  PT Assessment / Plan / Recommendation Comments on Treatment Session  Patient continuing to progress well with mobility. Need to review stairs and bed mobiilty next session.     Follow Up Recommendations  Home health PT;Supervision for mobility/OOB    Barriers to Discharge        Equipment Recommendations  3 in 1 bedside comode    Recommendations for Other Services    Frequency Min 5X/week   Plan Discharge plan remains appropriate;Frequency remains appropriate    Precautions / Restrictions Precautions Precautions: Back Precaution Comments: Patient able to recall all precautions Spinal Brace: Lumbar corset;Applied in sitting position   Pertinent Vitals/Pain     Mobility  Bed Mobility Bed Mobility: Sit to Sidelying Right Right Sidelying to Sit: 4: Min guard;HOB flat Sit to Sidelying Right: 4: Min assist;HOB flat Details for Bed Mobility Assistance: A with LEs and cues for safe technique Transfers Sit to Stand: 4: Min guard;With upper extremity assist;From chair/3-in-1;With armrests Stand to Sit: 4: Min guard;With upper extremity assist;To chair/3-in-1;With armrests Ambulation/Gait Ambulation/Gait Assistance: 5: Supervision Ambulation Distance (Feet): 400 Feet Assistive device: Rolling walker Ambulation/Gait Assistance Details: Cues to stay close into RW Gait Pattern: Step-through pattern;Decreased stride length    Exercises     PT Diagnosis:    PT Problem List:   PT Treatment Interventions:     PT Goals Acute Rehab PT Goals PT Goal: Rolling Supine to Right Side - Progress: Progressing toward goal PT Goal: Supine/Side to Sit - Progress: Progressing toward goal PT Goal: Sit to Stand - Progress: Progressing toward goal PT Goal: Stand to Sit - Progress: Progressing toward goal PT Goal:  Ambulate - Progress: Progressing toward goal  Visit Information  Last PT Received On: 07/02/12 Assistance Needed: +1    Subjective Data      Cognition  Overall Cognitive Status: Appears within functional limits for tasks assessed/performed Arousal/Alertness: Awake/alert Orientation Level: Appears intact for tasks assessed Behavior During Session: [facility] for tasks performed    Balance     End of Session PT - End of Session Equipment Utilized During Treatment: Gait belt;Back brace Activity Tolerance: Patient tolerated treatment well Patient left: in chair;with call bell/phone within reach Nurse Communication: Mobility status   GP     Fredrich Birks 07/02/2012, 9:20 AM 07/02/2012 Fredrich Birks PTA [phone] pager [phone] office

## 2012-07-02 NOTE — Progress Notes (Signed)
Occupational Therapy Treatment Patient Details Name: Derrick Hinton MRN: [id number] DOB: 1961/09/07 Today's Date: 07/02/2012 Time: 7829-5621 OT Time Calculation (min): 26 min  OT Assessment / Plan / Recommendation Comments on Treatment Session Pt is progressing well toward goals.  Will benefit from continued AE and back brace education before return home.    Follow Up Recommendations  No OT follow up    Barriers to Discharge       Equipment Recommendations  None recommended by OT (pt reports he has 3n1)    Recommendations for Other Services    Frequency Min 2X/week   Plan Discharge plan remains appropriate    Precautions / Restrictions Precautions Precautions: Back Precaution Comments: Patient able to recall all precautions Required Braces or Orthoses: Spinal Brace Spinal Brace: Lumbar corset;Applied in sitting position Restrictions Weight Bearing Restrictions: No   Pertinent Vitals/Pain See vitals    ADL  Lower Body Bathing: Simulated;Supervision/safety Where Assessed - Lower Body Bathing: Supported sitting Lower Body Dressing: Performed;Minimal assistance Where Assessed - Lower Body Dressing: Supported sitting Toilet Transfer: Simulated;Min Pension scheme manager Method: Sit to Barista:  (chair) Equipment Used: Back brace;Rolling walker;Gait belt Transfers/Ambulation Related to ADLs: supervision with RW ADL Comments: Provided AE education for LB bathing/dressing.  Pt would benefit from continued education to reinforce technique.  Pt reports that he does have a reacher at home.  Recommended use of 3n1 in tub as a shower seat.  Pt reports that he does have 3n1 at home from previous back surgery.  Educated pt on donning brace and proper positioning but pt continues to require assist.      OT Diagnosis:    OT Problem List:   OT Treatment Interventions:     OT Goals ADL Goals Pt Will Perform Lower Body Bathing: with supervision;Sit to stand  from bed;Sit to stand from chair;Sit to stand in shower ADL Goal: Lower Body Bathing - Progress: Progressing toward goals Pt Will Perform Lower Body Dressing: with supervision;with adaptive equipment;Sit to stand from bed;Sit to stand from chair ADL Goal: Lower Body Dressing - Progress: Progressing toward goals Pt Will Transfer to Toilet: with supervision;Comfort height toilet;3-in-1;Ambulation ADL Goal: Toilet Transfer - Progress: Progressing toward goals Additional ADL Goal #1: Pt/family will be independent with donning brace ADL Goal: Additional Goal #1 - Progress: Progressing toward goals  Visit Information  Last OT Received On: 07/02/12 Assistance Needed: +1    Subjective Data      Prior Functioning       Cognition  Overall Cognitive Status: Appears within functional limits for tasks assessed/performed Arousal/Alertness: Awake/alert Orientation Level: Appears intact for tasks assessed Behavior During Session: [facility] for tasks performed    Mobility  Shoulder Instructions Bed Mobility Bed Mobility: Sit to Sidelying Right Right Sidelying to Sit: 4: Min guard;HOB flat Sit to Sidelying Right: 4: Min assist;HOB flat Details for Bed Mobility Assistance: A with LEs and cues for safe technique Transfers Sit to Stand: 4: Min guard;With upper extremity assist;From chair/3-in-1;With armrests Stand to Sit: 4: Min guard;With upper extremity assist;To chair/3-in-1;With armrests       Exercises      Balance     End of Session OT - End of Session Equipment Utilized During Treatment: Gait belt;Back brace Activity Tolerance: Patient tolerated treatment well Patient left: in chair;with call bell/phone within reach Nurse Communication: Mobility status  GO    07/02/2012 Cipriano Mile OTR/L Pager [phone] Office [phone]  Cipriano Mile 07/02/2012, 12:55 PM

## 2012-07-03 LAB — GLUCOSE, CAPILLARY

## 2012-07-03 NOTE — Progress Notes (Signed)
Subjective: 4 Days Post-Op Procedure(s) (LRB): POSTERIOR LUMBAR FUSION 3 WITH HARDWARE REMOVAL (N/A) Patient reports pain as moderate.    Objective: Vital signs in last 24 hours: Temp:  [98 F (36.7 C)-99.4 F (37.4 C)] 98.3 F (36.8 C) (08/31 0554) Pulse Rate:  [85-105] 85  (08/31 0554) Resp:  [16-19] 18  (08/31 0554) BP: (115-136)/(66-76) 118/67 mmHg (08/31 0554) SpO2:  [92 %-95 %] 95 % (08/31 0554)  Intake/Output from previous day: 08/30 0701 - 08/31 0700 In: 773 [I.V.:773] Out: -  Intake/Output this shift:     Basename 07/01/12 1012  HGB 12.4*    Basename 07/01/12 1012  WBC --  RBC --  HCT 37.7*  PLT --    Basename 07/01/12 1012  NA 137  K 4.5  CL 98  CO2 27  BUN 6  CREATININE 0.66  GLUCOSE 234*  CALCIUM 9.2   No results found for this basename: LABPT:2,INR:2 in the last 72 hours  Neurologically intact  Assessment/Plan: 4 Days Post-Op Procedure(s) (LRB): POSTERIOR LUMBAR FUSION 3 WITH HARDWARE REMOVAL (N/A) Discharge home.   Jolanda Mccann C 07/03/2012, 8:45 AM

## 2012-07-05 LAB — POCT I-STAT 4, (NA,K, GLUC, HGB,HCT)
Glucose, Bld: 173 mg/dL — ABNORMAL HIGH (ref 70–99)
Glucose, Bld: 187 mg/dL — ABNORMAL HIGH (ref 70–99)
Hemoglobin: 11.9 g/dL — ABNORMAL LOW (ref 13.0–17.0)
Potassium: 4.3 mEq/L (ref 3.5–5.1)
Sodium: 139 mEq/L (ref 135–145)

## 2012-07-07 ENCOUNTER — Encounter: Payer: Self-pay | Admitting: Family Medicine

## 2012-07-07 ENCOUNTER — Ambulatory Visit (INDEPENDENT_AMBULATORY_CARE_PROVIDER_SITE_OTHER): Payer: Medicare PPO | Admitting: Family Medicine

## 2012-07-07 ENCOUNTER — Other Ambulatory Visit: Payer: Self-pay | Admitting: Family Medicine

## 2012-07-07 VITALS — BP 144/72 | HR 85 | Resp 18 | Ht 71.0 in | Wt 239.0 lb

## 2012-07-07 DIAGNOSIS — M47816 Spondylosis without myelopathy or radiculopathy, lumbar region: Secondary | ICD-10-CM

## 2012-07-07 DIAGNOSIS — E119 Type 2 diabetes mellitus without complications: Secondary | ICD-10-CM

## 2012-07-07 DIAGNOSIS — I10 Essential (primary) hypertension: Secondary | ICD-10-CM

## 2012-07-07 DIAGNOSIS — M48062 Spinal stenosis, lumbar region with neurogenic claudication: Secondary | ICD-10-CM

## 2012-07-07 DIAGNOSIS — E785 Hyperlipidemia, unspecified: Secondary | ICD-10-CM

## 2012-07-07 DIAGNOSIS — M47817 Spondylosis without myelopathy or radiculopathy, lumbosacral region: Secondary | ICD-10-CM

## 2012-07-07 MED ORDER — METHADONE HCL 10 MG PO TABS: ORAL_TABLET | ORAL | Status: DC

## 2012-07-07 NOTE — Patient Instructions (Addendum)
F/u in December, please call if you need me before.  I am happy surgery went well.  Reuce oxy IR to 3 times daily, then in next two months to twice daily, continue methadone as before, fill only when due you willl get September script today

## 2012-07-11 NOTE — Assessment & Plan Note (Signed)
Hyperlipidemia:Low fat diet discussed and encouraged.  Uncontrolled, no med change, reduction in butter, cheese, fried food and red meat stressed

## 2012-07-11 NOTE — Assessment & Plan Note (Signed)
Followed by endo, with improvement in control

## 2012-07-11 NOTE — Progress Notes (Signed)
  Subjective:    Patient ID: Derrick Hinton, male    DOB: 09-06-1961, 51 y.o.   MRN: [id number]  HPI The PT is here for follow up and re-evaluation of chronic medical conditions, medication management and review of any available recent lab and radiology data.  Preventive health is updated, specifically  Cancer screening and Immunization.   Had back surgery last week, reports symptom improvement already, want to maintain current  The PT denies any adverse reactions to current medications since the last visit.  There are no new concerns.  There are no specific complaints       Review of Systems See HPI Denies recent fever or chills. Denies sinus pressure, nasal congestion, ear pain or sore throat. Denies chest congestion, productive cough or wheezing. Denies chest pains, palpitations and leg swelling Denies abdominal pain, nausea, vomiting,diarrhea or constipation.   Denies dysuria, frequency, hesitancy or incontinence.  Denies headaches, seizures, numbness, or tingling. Denies depression, anxiety or insomnia. Denies skin break down or rash.        Objective:   Physical Exam  Patient alert and oriented and in no cardiopulmonary distress.  HEENT: No facial asymmetry, EOMI, no sinus tenderness,  oropharynx pink and moist.  Neck supple no adenopathy.  Chest: Clear to auscultation bilaterally.  CVS: S1, S2 no murmurs, no S3.  ABD: Soft non tender. Bowel sounds normal.  Ext: No edema  MS: decreased  ROM spine,adequate in  shoulders, hips and knees.  Skin: Intact, no ulcerations or rash noted.  Psych: Good eye contact, normal affect. Memory intact not anxious or depressed appearing.  CNS: CN 2-12 intact, power, tone and sensation normal throughout.       Assessment & Plan:

## 2012-07-11 NOTE — Assessment & Plan Note (Addendum)
Reports improved pain, continue chronic pain meds, however reduce dosage

## 2012-07-11 NOTE — Assessment & Plan Note (Signed)
Not at goal despite polypharmacy, low sodium diet, weuight loss, and regular physical activity stressed, no med change

## 2012-07-13 NOTE — Discharge Summary (Signed)
Physician Discharge Summary  Patient ID: Derrick Hinton MRN: [id number] DOB/AGE: Aug 29, 1961 51 y.o.  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 Adjacent segment spinal stenosis at L3-4  Past Medical History  Diagnosis Date  . Allergic rhinitis   . Hyperlipidemia   . Obesity   . Back pain   . Tobacco abuse     40 pack years; discontinued in 05/2012  . Hypertension     takes meds daily  . Depression     takes meds daily  . Diabetes mellitus, type 1     takes insulin daily - fasting sugar around 160s  . Chronic kidney disease     Sees Dr. Kristian Covey for kidney function  . Arthritis     Surgeries: Procedure(s): POSTERIOR LUMBAR FUSION 3 WITH HARDWARE REMOVAL on 06/29/2012. Including removal of hardware L4-S1 and extension of fusion to include L3-S1 with redo hardware at L4-5 and L5-S1.   Consultants (if any):  none  Discharged Condition: Improved  Hospital Course: Derrick Hinton is an 51 y.o. male who was admitted 06/29/2012 with a diagnosis of Degenerative spondylolisthesis and went to the operating room on 06/29/2012 and underwent the above named procedures.    He was given perioperative antibiotics:  Anti-infectives     Start     Dose/Rate Route Frequency Ordered Stop   06/29/12 2205   ceFAZolin (ANCEF) IVPB 1 g/50 mL premix        1 g 100 mL/hr over 30 Minutes Intravenous Every 8 hours 06/29/12 2201 06/30/12 0702   06/28/12 1444   ceFAZolin (ANCEF) 3 g in dextrose 5 % 50 mL IVPB        3 g 160 mL/hr over 30 Minutes Intravenous 60 min pre-op 06/28/12 1444  06/29/12 1150        Pt seen and treated by PT. Fitted with [employer] which was utilized for ambulation and fitted well.  Pt tolerated OT well.   He developed a distended abdomen and severe constipation with poor bowel function which eventually was relieved with stool softener and laxatives.  Prior to discharge he was having flatus, BM and was tolerating a regular diet.  He was given sequential compression devices, early ambulation, for DVT prophylaxis.  He benefited maximally from the hospital stay and there were no complications.    Recent vital signs:  Filed Vitals:   07/03/12 1001  BP: 150/97  Pulse:   Temp:   Resp:     Recent laboratory studies:  Lab Results  Component Value Date   HGB 12.4* 07/01/2012   HGB 11.2* 06/30/2012   HGB 11.9* 06/29/2012   Lab Results  Component Value Date   WBC 11.9* 06/30/2012   PLT 131* 06/30/2012   Lab Results  Component Value Date   INR 1.11 06/23/2012   Lab Results  Component Value Date   NA 137 07/01/2012   K 4.5 07/01/2012   CL 98 07/01/2012   CO2 27 07/01/2012   BUN 6 07/01/2012   CREATININE 0.66 07/01/2012   GLUCOSE 234* 07/01/2012    Discharge Medications:   Medication List  As of 07/13/2012  4:03 PM   STOP taking these medications  glucose blood test strip      HUMALOG KWIKPEN 100 UNIT/ML injection      insulin detemir 100 UNIT/ML injection      methadone 10 MG tablet      sitaGLIPtan-metformin 50-1000 MG per tablet         TAKE these medications         amLODipine-valsartan 10-320 MG per tablet   Commonly known as: EXFORGE   Take 1 tablet by mouth daily.      ASPIRIN LOW DOSE 81 MG EC tablet   Generic drug: aspirin   Take 81 mg by mouth daily.      B-D ULTRAFINE III SHORT PEN 31G X 8 MM Misc   Generic drug: Insulin Pen Needle      buPROPion 150 MG 12 hr tablet   Commonly known as: WELLBUTRIN SR   Take 1 tablet (150 mg total) by mouth daily.      hydrochlorothiazide 25 MG tablet   Commonly known as:  HYDRODIURIL   Take 1 tablet (25 mg total) by mouth daily.      temazepam 15 MG capsule   Commonly known as: RESTORIL   Take 15 mg by mouth at bedtime as needed. For sleep      vitamin C 1000 MG tablet   Take 1,000 mg by mouth daily.            Diagnostic Studies: Dg Lumbar Spine Complete  06/29/2012  *RADIOLOGY REPORT*  Clinical Data: Posterior lumbar interbody fusion of L3-L4, L4-L5, and L5-S1  DG C-ARM GT 120 MIN, LUMBAR SPINE - COMPLETE 4+ VIEW  Technique: Intraoperative spot radiographs obtained during posterior lumbar fusion.  Comparison:  Prior lumbar spine radiographs 04/02/2012, CT myelogram 04/02/2012.  Findings: Fluoroscopic spot images demonstrate revision versus extension of existing posterior spinal hardware.  Bilateral pedicle screw and rod constructs now span L3-S1.  Interbody spacers are identified at L3-L4, L4-L5 and L5-S1.  No immediate hardware complication.  IMPRESSION:  Intraoperative spot radiographs demonstrating extension versus revision of posterior spinal fusion hardware which now spans L3-S1 with interbody spacers at all levels.   Original Report Authenticated By: Alvino Blood C-arm Gt 120 Min  06/29/2012  *RADIOLOGY REPORT*  Clinical Data: Posterior lumbar interbody fusion of L3-L4, L4-L5, and L5-S1  DG C-ARM GT 120 MIN, LUMBAR SPINE - COMPLETE 4+ VIEW  Technique: Intraoperative spot radiographs obtained during posterior lumbar fusion.  Comparison:  Prior lumbar spine radiographs 04/02/2012, CT myelogram 04/02/2012.  Findings: Fluoroscopic spot images demonstrate revision versus extension of existing posterior spinal hardware.  Bilateral pedicle screw and rod constructs now span L3-S1.  Interbody spacers are identified at L3-L4, L4-L5 and L5-S1.  No immediate hardware complication.  IMPRESSION:  Intraoperative spot radiographs demonstrating extension versus revision of posterior spinal fusion hardware which now spans L3-S1 with interbody spacers at all levels.   Original  Report Authenticated By: Vilma Prader     Disposition: 06-Home-Health Care Svc  Discharge Orders    Future Appointments: Provider: Department: Dept Phone: Center:   10/19/2012 10:45 AM Kerri Perches, MD [facility] [phone] [facility]     Future Orders Please Complete By Expires   Ice pack      Comments:   Apply to lumbar spine.    Use brace when sitting and out of bed even to go to bathroom. Walk in house for first 2 weeks then may start to get out slowly increasing distances up to one mile by 4-6 weeks post op.  After 5 days may shower and change dressing following bathing with shower.When bathing remove the brace shower and replace brace before getting out of the shower. If drainage, keep dry dressing and do not bathe the incision, use an moisture impervious dressing. Please call and return for scheduled follow up appointment 2 weeks from the time of surgery.  Follow-up Information    Follow up with NITKA,JAMES E, MD. Schedule an appointment as soon as possible for a visit in 5 weeks.   Contact information:   [employer] [address] [place] [address] [phone]       Follow up with Wende Neighbors, PA. Schedule an appointment as soon as possible for a visit in 2 weeks.   Contact information:   [address] [place] [address] [phone]           Signed: Wende Neighbors 07/13/2012, 4:03 PM

## 2012-07-14 NOTE — Progress Notes (Signed)
Patient's exam and lab reviewed with [facility].

## 2012-07-14 NOTE — Discharge Summary (Signed)
Patient exam, lab and discharge summary reviewed with [place] PA-C.  

## 2012-08-02 ENCOUNTER — Other Ambulatory Visit: Payer: Self-pay

## 2012-08-02 ENCOUNTER — Telehealth: Payer: Self-pay | Admitting: Family Medicine

## 2012-08-02 DIAGNOSIS — M48062 Spinal stenosis, lumbar region with neurogenic claudication: Secondary | ICD-10-CM

## 2012-08-02 MED ORDER — METHADONE HCL 10 MG PO TABS: ORAL_TABLET | ORAL | Status: DC

## 2012-08-02 NOTE — Telephone Encounter (Signed)
Percocet has tylenol which is damaging to liver over time in high amts,I suggest he stay on curren t meds for at least another 2 months , he can sched ov in December to re [employer]

## 2012-08-02 NOTE — Telephone Encounter (Signed)
Does this require an office visit?

## 2012-08-03 NOTE — Telephone Encounter (Signed)
Pt aware.

## 2012-08-03 NOTE — Telephone Encounter (Signed)
Patient aware.

## 2012-08-30 ENCOUNTER — Ambulatory Visit (INDEPENDENT_AMBULATORY_CARE_PROVIDER_SITE_OTHER): Payer: Medicare PPO

## 2012-08-30 DIAGNOSIS — Z23 Encounter for immunization: Secondary | ICD-10-CM

## 2012-09-10 ENCOUNTER — Other Ambulatory Visit: Payer: Self-pay

## 2012-09-10 DIAGNOSIS — M48062 Spinal stenosis, lumbar region with neurogenic claudication: Secondary | ICD-10-CM

## 2012-09-10 MED ORDER — METHADONE HCL 10 MG PO TABS: ORAL_TABLET | ORAL | Status: DC

## 2012-09-22 ENCOUNTER — Telehealth: Payer: Self-pay | Admitting: Family Medicine

## 2012-09-22 NOTE — Telephone Encounter (Signed)
Form completed , he can collect

## 2012-09-28 NOTE — Telephone Encounter (Signed)
Called patient is he aware to pick up

## 2012-10-15 ENCOUNTER — Other Ambulatory Visit: Payer: Self-pay

## 2012-10-15 DIAGNOSIS — M48062 Spinal stenosis, lumbar region with neurogenic claudication: Secondary | ICD-10-CM

## 2012-10-15 DIAGNOSIS — M47816 Spondylosis without myelopathy or radiculopathy, lumbar region: Secondary | ICD-10-CM

## 2012-10-15 MED ORDER — OXYCODONE HCL 5 MG PO TABS: 5.0000 mg | ORAL_TABLET | Freq: Three times a day (TID) | ORAL | Status: DC | PRN

## 2012-10-15 MED ORDER — METHADONE HCL 10 MG PO TABS: ORAL_TABLET | ORAL | Status: DC

## 2012-10-19 ENCOUNTER — Ambulatory Visit (INDEPENDENT_AMBULATORY_CARE_PROVIDER_SITE_OTHER): Payer: Medicare PPO | Admitting: Family Medicine

## 2012-10-19 ENCOUNTER — Encounter: Payer: Self-pay | Admitting: Family Medicine

## 2012-10-19 VITALS — BP 148/80 | HR 82 | Resp 16 | Ht 71.0 in | Wt 244.8 lb

## 2012-10-19 DIAGNOSIS — E119 Type 2 diabetes mellitus without complications: Secondary | ICD-10-CM

## 2012-10-19 DIAGNOSIS — F17200 Nicotine dependence, unspecified, uncomplicated: Secondary | ICD-10-CM

## 2012-10-19 DIAGNOSIS — M48062 Spinal stenosis, lumbar region with neurogenic claudication: Secondary | ICD-10-CM

## 2012-10-19 DIAGNOSIS — I10 Essential (primary) hypertension: Secondary | ICD-10-CM

## 2012-10-19 DIAGNOSIS — M47817 Spondylosis without myelopathy or radiculopathy, lumbosacral region: Secondary | ICD-10-CM

## 2012-10-19 DIAGNOSIS — E669 Obesity, unspecified: Secondary | ICD-10-CM

## 2012-10-19 DIAGNOSIS — E785 Hyperlipidemia, unspecified: Secondary | ICD-10-CM

## 2012-10-19 DIAGNOSIS — F3289 Other specified depressive episodes: Secondary | ICD-10-CM | POA: Insufficient documentation

## 2012-10-19 DIAGNOSIS — F329 Major depressive disorder, single episode, unspecified: Secondary | ICD-10-CM | POA: Insufficient documentation

## 2012-10-19 DIAGNOSIS — M47816 Spondylosis without myelopathy or radiculopathy, lumbar region: Secondary | ICD-10-CM

## 2012-10-19 DIAGNOSIS — Z720 Tobacco use: Secondary | ICD-10-CM

## 2012-10-19 MED ORDER — BUPROPION HCL ER (XL) 300 MG PO TB24: 300.0000 mg | ORAL_TABLET | ORAL | Status: DC

## 2012-10-19 MED ORDER — OXYCODONE HCL 5 MG PO TABS: 5.0000 mg | ORAL_TABLET | Freq: Two times a day (BID) | ORAL | Status: DC | PRN

## 2012-10-19 NOTE — Patient Instructions (Addendum)
F/u in 2 month, call if you need me before  Dose increase in depression medication to wellbutrin 300mg  once daily, take two 150mg  once daily till done.  Pain med is oxy iR one twice daily as needed, in 2 months, likely to reduce to one daily as needed.  Please stop smoking and work on weight loss

## 2012-10-19 NOTE — Progress Notes (Signed)
  Subjective:    Patient ID: Derrick Hinton, male    DOB: Jul 04, 1961, 51 y.o.   MRN: [id number]  HPI The PT is here for follow up and re-evaluation of chronic medical conditions, medication management and review of any available recent lab and radiology data.  Preventive health is updated, specifically  Cancer screening and Immunization.   The PT denies any adverse reactions to current medications since the last visit.  There are no new concerns.  There are no specific complaints       Review of Systems See HPI Denies recent fever or chills. Denies sinus pressure, nasal congestion, ear pain or sore throat. Denies chest congestion, productive cough or wheezing. Denies chest pains, palpitations and leg swelling Denies abdominal pain, nausea, vomiting,diarrhea or constipation.   Denies dysuria, frequency, hesitancy or incontinence. Chronic back pain less since surgery, willing to lower dependence on pain meds Denies headaches, seizures, numbness, or tingling. C/o increased depression due to limitation in what he is able to do, not suicidal or homicidal Denies skin break down or rash.        Objective:   Physical Exam Patient alert and oriented and in no cardiopulmonary distress.  HEENT: No facial asymmetry, EOMI, no sinus tenderness,  oropharynx pink and moist.  Neck supple no adenopathy.  Chest: Clear to auscultation bilaterally.Decreased air entry  CVS: S1, S2 no murmurs, no S3.  ABD: Soft non tender. Bowel sounds normal.  Ext: No edema  MS: decreased  ROM spine, shoulders, hips and knees.  Skin: Intact, no ulcerations or rash noted.  Psych: Good eye contact, normal affect. Memory intact not anxious or depressed appearing.  CNS: CN 2-12 intact, power, normal throughout.        Assessment & Plan:

## 2012-10-20 ENCOUNTER — Other Ambulatory Visit: Payer: Self-pay

## 2012-10-20 DIAGNOSIS — I10 Essential (primary) hypertension: Secondary | ICD-10-CM

## 2012-10-20 MED ORDER — HYDROCHLOROTHIAZIDE 25 MG PO TABS: ORAL_TABLET | ORAL | Status: DC

## 2012-10-20 MED ORDER — AMLODIPINE BESYLATE-VALSARTAN 10-320 MG PO TABS: 1.0000 | ORAL_TABLET | Freq: Every day | ORAL | Status: DC

## 2012-10-20 MED ORDER — BACLOFEN 10 MG PO TABS: ORAL_TABLET | ORAL | Status: DC

## 2012-11-12 ENCOUNTER — Other Ambulatory Visit: Payer: Self-pay

## 2012-11-12 DIAGNOSIS — M47816 Spondylosis without myelopathy or radiculopathy, lumbar region: Secondary | ICD-10-CM

## 2012-11-12 MED ORDER — OXYCODONE HCL 5 MG PO TABS: 5.0000 mg | ORAL_TABLET | Freq: Two times a day (BID) | ORAL | Status: DC | PRN

## 2012-11-13 ENCOUNTER — Encounter: Payer: Self-pay | Admitting: Family Medicine

## 2012-11-13 NOTE — Assessment & Plan Note (Signed)
Uncontrolled, not suicidal or homicidal. Increase wellbutrin dose, hopefully this will help with smoking cessation also

## 2012-11-13 NOTE — Assessment & Plan Note (Signed)
Not yet at goal, pt on multiple meds currently . Focus on lifestyle change to augment meds, no change in meds

## 2012-11-13 NOTE — Assessment & Plan Note (Signed)
Uncontrolled, followed by endo 

## 2012-11-13 NOTE — Assessment & Plan Note (Signed)
Improved pain since surgery, gradual reduction in pain meds

## 2012-11-13 NOTE — Assessment & Plan Note (Signed)
Deteriorated. Patient re-educated about  the importance of commitment to a  minimum of 150 minutes of exercise per week. The importance of healthy food choices with portion control discussed. Encouraged to start a food diary, count calories and to consider  joining a support group. Sample diet sheets offered. Goals set by the patient for the next several months.    

## 2012-11-13 NOTE — Assessment & Plan Note (Signed)
Resumed smoking, Patient counseled for approximately 5 minutes regarding the health risks of ongoing nicotine use, specifically all types of cancer, heart disease, stroke and respiratory failure. The options available for help with cessation ,the behavioral changes to assist the process, and the option to either gradully reduce usage  Or abruptly stop.is also discussed. Pt is also encouraged to set specific goals in number of cigarettes used daily, as well as to set a quit date.

## 2012-11-13 NOTE — Assessment & Plan Note (Signed)
Triglycerides elevated, need to reduce cheese and butter and fried foods

## 2012-12-10 ENCOUNTER — Other Ambulatory Visit: Payer: Self-pay

## 2012-12-10 DIAGNOSIS — M47816 Spondylosis without myelopathy or radiculopathy, lumbar region: Secondary | ICD-10-CM

## 2012-12-10 MED ORDER — OXYCODONE HCL 5 MG PO TABS: 5.0000 mg | ORAL_TABLET | Freq: Two times a day (BID) | ORAL | Status: DC | PRN

## 2012-12-20 ENCOUNTER — Ambulatory Visit (INDEPENDENT_AMBULATORY_CARE_PROVIDER_SITE_OTHER): Payer: Medicare PPO | Admitting: Family Medicine

## 2012-12-20 ENCOUNTER — Encounter: Payer: Self-pay | Admitting: Family Medicine

## 2012-12-20 VITALS — BP 140/84 | HR 71 | Resp 16 | Ht 71.0 in | Wt 244.0 lb

## 2012-12-20 DIAGNOSIS — Z125 Encounter for screening for malignant neoplasm of prostate: Secondary | ICD-10-CM

## 2012-12-20 DIAGNOSIS — E669 Obesity, unspecified: Secondary | ICD-10-CM

## 2012-12-20 DIAGNOSIS — I10 Essential (primary) hypertension: Secondary | ICD-10-CM

## 2012-12-20 DIAGNOSIS — E785 Hyperlipidemia, unspecified: Secondary | ICD-10-CM

## 2012-12-20 DIAGNOSIS — F3289 Other specified depressive episodes: Secondary | ICD-10-CM

## 2012-12-20 DIAGNOSIS — E119 Type 2 diabetes mellitus without complications: Secondary | ICD-10-CM

## 2012-12-20 DIAGNOSIS — F329 Major depressive disorder, single episode, unspecified: Secondary | ICD-10-CM

## 2012-12-20 LAB — COMPLETE METABOLIC PANEL WITH GFR
BUN: 16 mg/dL (ref 6–23)
CO2: 28 mEq/L (ref 19–32)
Creat: 0.92 mg/dL (ref 0.50–1.35)
GFR, Est African American: >89 mL/min
GFR, Est Non African American: >89 mL/min
Glucose, Bld: 239 mg/dL — ABNORMAL HIGH (ref 70–99)
Sodium: 137 mEq/L (ref 135–145)
Total Bilirubin: 0.6 mg/dL (ref 0.3–1.2)
Total Protein: 7.3 g/dL (ref 6.0–8.3)

## 2012-12-20 LAB — LIPID PANEL
LDL Cholesterol: 42 mg/dL (ref 0–99)
Triglycerides: 185 mg/dL — ABNORMAL HIGH (ref <150)

## 2012-12-20 MED ORDER — CLONIDINE HCL 0.3 MG PO TABS: ORAL_TABLET | ORAL | Status: AC

## 2012-12-20 NOTE — Assessment & Plan Note (Signed)
Controlled, no change in medication  

## 2012-12-20 NOTE — Assessment & Plan Note (Signed)
Not at goal, increase clonidine dose, continue other meds as before. DASH diet and commitment to daily physical activity for a minimum of 30 minutes discussed and encouraged, as a part of hypertension management. The importance of attaining a healthy weight is also discussed.

## 2012-12-20 NOTE — Patient Instructions (Addendum)
Annual wellness in 3.5 month  Dose increase in clonidine to 2 tabs twice daily  hBA1C cmp and EGFR, pSA, lipid, and microalb form office  It is important that you exercise regularly at least 30 minutes 5 times a week. If you develop chest pain, have severe difficulty breathing, or feel very tired, stop exercising immediately and seek medical attention

## 2012-12-20 NOTE — Progress Notes (Signed)
  Subjective:    Patient ID: Derrick Hinton, male    DOB: 1961/04/25, 52 y.o.   MRN: [id number]  HPI The PT is here for follow up and re-evaluation of chronic medical conditions, medication management and review of any available recent lab and radiology data.  Preventive health is updated, specifically  Cancer screening and Immunization.   Questions or concerns regarding consultations or procedures which the PT has had in the interim are  addressed. The PT denies any adverse reactions to current medications since the last visit.  There are no new concerns.  There are no specific complaints , not very active and increased pain due to cold weather, plans to change this when weather warms up blod sugars he reports are doing fairly well, needs lab      Review of Systems See HPI Denies recent fever or chills. Denies sinus pressure, nasal congestion, ear pain or sore throat. Denies chest congestion, productive cough or wheezing. Denies chest pains, palpitations and leg swelling Denies abdominal pain, nausea, vomiting,diarrhea or constipation.   Denies dysuria, frequency, hesitancy or incontinence. Chronic back pain  and limitation in mobility. Denies headaches, seizures,  Denies depression, anxiety or insomnia. Denies skin break down or rash.        Objective:   Physical Exam  Patient alert and oriented and in no cardiopulmonary distress.  HEENT: No facial asymmetry, EOMI, no sinus tenderness,  oropharynx pink and moist.  Neck supple no adenopathy.  Chest: Clear to auscultation bilaterally.  CVS: S1, S2 no murmurs, no S3.  ABD: Soft non tender. Bowel sounds normal.  Ext: No edema  MS: Adequate though reduced  ROM spine, shoulders, hips and knees.  Skin: Intact, no ulcerations or rash noted.  Psych: Good eye contact, normal affect. Memory intact not anxious or depressed appearing.  CNS: CN 2-12 intact, power,  normal throughout.       Assessment & Plan:

## 2012-12-20 NOTE — Assessment & Plan Note (Signed)
Patient educated about the importance of limiting  Carbohydrate intake , the need to commit to daily physical activity for a minimum of 30 minutes , and to commit weight loss. The fact that changes in all these areas will reduce or eliminate all together the development of diabetes is stressed.   Updated la today will be forwarded to endo

## 2012-12-20 NOTE — Assessment & Plan Note (Signed)
upodated l today, pt on reduced dose schedule Hyperlipidemia:Low fat diet discussed and encouraged.

## 2012-12-20 NOTE — Assessment & Plan Note (Signed)
Deteriorated. Patient re-educated about  the importance of commitment to a  minimum of 150 minutes of exercise per week. The importance of healthy food choices with portion control discussed. Encouraged to start a food diary, count calories and to consider  joining a support group. Sample diet sheets offered. Goals set by the patient for the next several months.    

## 2012-12-21 ENCOUNTER — Other Ambulatory Visit: Payer: Self-pay

## 2012-12-21 LAB — MICROALBUMIN / CREATININE URINE RATIO
Microalb Creat Ratio: 132.2 mg/g — ABNORMAL HIGH (ref 0.0–30.0)
Microalb, Ur: 14.13 mg/dL — ABNORMAL HIGH (ref 0.00–1.89)

## 2012-12-21 MED ORDER — AMLODIPINE BESYLATE-VALSARTAN 10-320 MG PO TABS: 1.0000 | ORAL_TABLET | Freq: Every day | ORAL | Status: DC

## 2013-01-17 ENCOUNTER — Other Ambulatory Visit: Payer: Self-pay

## 2013-01-17 DIAGNOSIS — M47816 Spondylosis without myelopathy or radiculopathy, lumbar region: Secondary | ICD-10-CM

## 2013-01-17 MED ORDER — OXYCODONE HCL 5 MG PO TABS: 5.0000 mg | ORAL_TABLET | Freq: Two times a day (BID) | ORAL | Status: DC | PRN

## 2013-01-18 ENCOUNTER — Other Ambulatory Visit: Payer: Self-pay | Admitting: Family Medicine

## 2013-01-20 ENCOUNTER — Other Ambulatory Visit: Payer: Self-pay

## 2013-01-24 ENCOUNTER — Telehealth: Payer: Self-pay | Admitting: Family Medicine

## 2013-01-25 ENCOUNTER — Other Ambulatory Visit: Payer: Self-pay

## 2013-01-25 MED ORDER — TEMAZEPAM 15 MG PO CAPS: ORAL_CAPSULE | ORAL | Status: DC

## 2013-01-25 NOTE — Telephone Encounter (Signed)
Printed to be faxed

## 2013-02-11 ENCOUNTER — Ambulatory Visit: Payer: Medicare PPO | Admitting: Family Medicine

## 2013-02-16 ENCOUNTER — Other Ambulatory Visit: Payer: Self-pay

## 2013-02-16 ENCOUNTER — Telehealth: Payer: Self-pay

## 2013-02-16 DIAGNOSIS — I10 Essential (primary) hypertension: Secondary | ICD-10-CM

## 2013-02-16 DIAGNOSIS — M47816 Spondylosis without myelopathy or radiculopathy, lumbar region: Secondary | ICD-10-CM

## 2013-02-16 MED ORDER — OXYCODONE HCL 5 MG PO TABS: 5.0000 mg | ORAL_TABLET | Freq: Two times a day (BID) | ORAL | Status: DC | PRN

## 2013-02-16 MED ORDER — TEMAZEPAM 30 MG PO CAPS: ORAL_CAPSULE | ORAL | Status: DC

## 2013-02-16 NOTE — Telephone Encounter (Signed)
Said you told him at his last appt that he could take 2 of the temazepam but he only has enough for once daily. Please advise Wants them sent to [facility]

## 2013-02-16 NOTE — Telephone Encounter (Signed)
Noted and printed.  

## 2013-02-16 NOTE — Telephone Encounter (Signed)
Script needs to be changed to 30mg  tab 1 at night I will sign

## 2013-03-11 ENCOUNTER — Other Ambulatory Visit: Payer: Self-pay

## 2013-03-11 DIAGNOSIS — M47816 Spondylosis without myelopathy or radiculopathy, lumbar region: Secondary | ICD-10-CM

## 2013-03-11 MED ORDER — OXYCODONE HCL 5 MG PO TABS: 5.0000 mg | ORAL_TABLET | Freq: Two times a day (BID) | ORAL | Status: DC | PRN

## 2013-04-05 ENCOUNTER — Ambulatory Visit (INDEPENDENT_AMBULATORY_CARE_PROVIDER_SITE_OTHER): Payer: Medicare PPO | Admitting: Family Medicine

## 2013-04-05 ENCOUNTER — Encounter: Payer: Self-pay | Admitting: Family Medicine

## 2013-04-05 VITALS — BP 134/82 | HR 72 | Resp 16 | Ht 71.0 in | Wt 244.0 lb

## 2013-04-05 DIAGNOSIS — IMO0001 Reserved for inherently not codable concepts without codable children: Secondary | ICD-10-CM

## 2013-04-05 DIAGNOSIS — I10 Essential (primary) hypertension: Secondary | ICD-10-CM

## 2013-04-05 DIAGNOSIS — M48062 Spinal stenosis, lumbar region with neurogenic claudication: Secondary | ICD-10-CM

## 2013-04-05 DIAGNOSIS — E785 Hyperlipidemia, unspecified: Secondary | ICD-10-CM

## 2013-04-05 DIAGNOSIS — IMO0002 Reserved for concepts with insufficient information to code with codable children: Secondary | ICD-10-CM

## 2013-04-05 DIAGNOSIS — F3289 Other specified depressive episodes: Secondary | ICD-10-CM

## 2013-04-05 DIAGNOSIS — G4730 Sleep apnea, unspecified: Secondary | ICD-10-CM | POA: Insufficient documentation

## 2013-04-05 DIAGNOSIS — E1065 Type 1 diabetes mellitus with hyperglycemia: Secondary | ICD-10-CM

## 2013-04-05 DIAGNOSIS — R5381 Other malaise: Secondary | ICD-10-CM

## 2013-04-05 DIAGNOSIS — F329 Major depressive disorder, single episode, unspecified: Secondary | ICD-10-CM

## 2013-04-05 DIAGNOSIS — J309 Allergic rhinitis, unspecified: Secondary | ICD-10-CM

## 2013-04-05 MED ORDER — AMLODIPINE BESYLATE-VALSARTAN 10-320 MG PO TABS: 1.0000 | ORAL_TABLET | Freq: Every day | ORAL | Status: AC

## 2013-04-05 MED ORDER — BACLOFEN 10 MG PO TABS: 10.0000 mg | ORAL_TABLET | Freq: Three times a day (TID) | ORAL | Status: AC

## 2013-04-05 MED ORDER — LORATADINE 10 MG PO TABS: 10.0000 mg | ORAL_TABLET | Freq: Every day | ORAL | Status: AC

## 2013-04-05 MED ORDER — PSEUDOEPHEDRINE HCL 30 MG PO TABS: ORAL_TABLET | ORAL | Status: DC

## 2013-04-05 MED ORDER — ROSUVASTATIN CALCIUM 20 MG PO TABS: 20.0000 mg | ORAL_TABLET | Freq: Every day | ORAL | Status: AC

## 2013-04-05 MED ORDER — FLUTICASONE PROPIONATE 50 MCG/ACT NA SUSP: 2.0000 | Freq: Every day | NASAL | Status: AC

## 2013-04-05 MED ORDER — BUPROPION HCL ER (XL) 300 MG PO TB24: 300.0000 mg | ORAL_TABLET | ORAL | Status: DC

## 2013-04-05 MED ORDER — SITAGLIPTIN PHOS-METFORMIN HCL 50-1000 MG PO TABS: 1.0000 | ORAL_TABLET | Freq: Two times a day (BID) | ORAL | Status: AC

## 2013-04-05 MED ORDER — HYDROCHLOROTHIAZIDE 25 MG PO TABS: ORAL_TABLET | ORAL | Status: AC

## 2013-04-05 NOTE — Assessment & Plan Note (Signed)
Controlled, no change in medication DASH diet and commitment to daily physical activity for a minimum of 30 minutes discussed and encouraged, as a part of hypertension management. The importance of attaining a healthy weight is also discussed.  

## 2013-04-05 NOTE — Assessment & Plan Note (Signed)
Hyperlipidemia:Low fat diet discussed and encouraged.  Updated lab due, last check pt was uncontrolled

## 2013-04-05 NOTE — Assessment & Plan Note (Signed)
Patient advised to reduce carb and sweets, commit to regular physical activity, take meds as prescribed, test blood as directed, and attempt to lose weight, to improve blood sugar control. Updated lab next visit 

## 2013-04-05 NOTE — Assessment & Plan Note (Signed)
Improved, continue med 

## 2013-04-05 NOTE — Assessment & Plan Note (Signed)
Increased symptoms, pt to use 3 meds for control

## 2013-04-05 NOTE — Assessment & Plan Note (Signed)
Improved pain reduce dose of medication, increase use of muscle relaxant

## 2013-04-05 NOTE — Progress Notes (Signed)
  Subjective:    Patient ID: Derrick Hinton, male    DOB: 10-29-1961, 52 y.o.   MRN: [id number]  HPI The PT is here for follow up and re-evaluation of chronic medical conditions, medication management and review of any available recent lab and radiology data.  Preventive health is updated, specifically  Cancer screening and Immunization.   States pain is less and he is willing to continue to reduce oxycodone use. C/o excessive snoring and daytime sleepiness and wants to be referred for sleep study Blood sugar still not well controlled however follows with endo C/o uncontrolled allergy symptoms, watery nose, sneezing and itchy eyes x 1 month, he is "afraid to go outside"   Review of Systems See HPI Denies recent fever or chills.c/o fatigue and excessive daytime sleepiness. States that at times he is told he stops breathing in his sleep Denies chest congestion, productive cough or wheezing. Denies chest pains, palpitations and leg swelling Denies abdominal pain, nausea, vomiting,diarrhea or constipation.   Denies dysuria, frequency, hesitancy or incontinence.  Denies headaches, seizures, numbness, or tingling. Denies depression, anxiety or insomnia. Denies skin break down or rash.        Objective:   Physical Exam  Patient alert and oriented and in no cardiopulmonary distress.  HEENT: No facial asymmetry, EOMI, no sinus tenderness,  oropharynx pink and moist.  Neck supple no adenopathy.  Chest: Clear to auscultation bilaterally.  CVS: S1, S2 no murmurs, no S3.  ABD: Soft non tender. Bowel sounds normal.  Ext: No edema  MS: Adequate though reduced  ROM spine, hips and knees.  Skin: Intact, no ulcerations or rash noted.  Psych: Good eye contact, normal affect. Memory intact not anxious or depressed appearing.  CNS: CN 2-12 intact, power, tone and sensation normal throughout.       Assessment & Plan:

## 2013-04-05 NOTE — Assessment & Plan Note (Signed)
Based on symptoms pt referred for sleep eval

## 2013-04-05 NOTE — Patient Instructions (Addendum)
Annual wellness in  4 month, call if you need me before  Fasting lipid, cmp and EGFR and TSH fin 4 month  REDUCE oxyIR to one daily, so the tablets you collected last week should last 2 month  Next fill is 7/28 and that is for one daily, 30 tablets each month OK to increase baclofen to 3 times daily if needed, new direction sent in today  You are referred for sleep study

## 2013-04-11 ENCOUNTER — Telehealth: Payer: Self-pay | Admitting: Family Medicine

## 2013-04-13 ENCOUNTER — Other Ambulatory Visit: Payer: Self-pay

## 2013-04-13 MED ORDER — INSULIN PEN NEEDLE 31G X 8 MM MISC: Status: AC

## 2013-04-13 NOTE — Telephone Encounter (Signed)
Concern addressed

## 2013-05-30 ENCOUNTER — Telehealth: Payer: Self-pay | Admitting: Family Medicine

## 2013-05-30 MED ORDER — TEMAZEPAM 30 MG PO CAPS: ORAL_CAPSULE | ORAL | Status: AC

## 2013-05-30 NOTE — Telephone Encounter (Signed)
Will fax to requested pharmacy

## 2013-06-21 ENCOUNTER — Other Ambulatory Visit: Payer: Self-pay

## 2013-06-21 DIAGNOSIS — M48062 Spinal stenosis, lumbar region with neurogenic claudication: Secondary | ICD-10-CM

## 2013-06-21 MED ORDER — OXYCODONE HCL 5 MG PO TABS: ORAL_TABLET | ORAL | Status: DC

## 2013-06-23 ENCOUNTER — Other Ambulatory Visit: Payer: Self-pay | Admitting: Specialist

## 2013-06-23 ENCOUNTER — Ambulatory Visit
Admission: RE | Admit: 2013-06-23 | Discharge: 2013-06-23 | Disposition: A | Discharge status: Home / Self Care | Payer: Medicare PPO | Source: Ambulatory Visit | Attending: Specialist | Admitting: Specialist

## 2013-06-23 DIAGNOSIS — M549 Dorsalgia, unspecified: Secondary | ICD-10-CM

## 2013-07-15 ENCOUNTER — Other Ambulatory Visit: Payer: Self-pay

## 2013-07-15 DIAGNOSIS — M48062 Spinal stenosis, lumbar region with neurogenic claudication: Secondary | ICD-10-CM

## 2013-07-15 MED ORDER — OXYCODONE HCL 5 MG PO TABS: ORAL_TABLET | ORAL | Status: DC

## 2013-07-29 ENCOUNTER — Other Ambulatory Visit: Payer: Self-pay | Admitting: Family Medicine

## 2013-07-29 LAB — COMPLETE METABOLIC PANEL WITH GFR
AST: 26 U/L (ref 0–37)
Alkaline Phosphatase: 71 U/L (ref 39–117)
BUN: 13 mg/dL (ref 6–23)
Calcium: 9.4 mg/dL (ref 8.4–10.5)
Creat: 0.81 mg/dL (ref 0.50–1.35)
Total Bilirubin: 0.5 mg/dL (ref 0.3–1.2)

## 2013-07-29 LAB — LIPID PANEL
Cholesterol: 77 mg/dL (ref 0–200)
HDL: 32 mg/dL — ABNORMAL LOW (ref >39)
LDL Cholesterol: 8 mg/dL (ref 0–99)
Total CHOL/HDL Ratio: 2.4 Ratio
Triglycerides: 184 mg/dL — ABNORMAL HIGH (ref <150)
VLDL: 37 mg/dL (ref 0–40)

## 2013-08-01 ENCOUNTER — Ambulatory Visit: Payer: Self-pay | Admitting: Anesthesiology

## 2013-08-05 ENCOUNTER — Other Ambulatory Visit: Payer: Self-pay

## 2013-08-05 ENCOUNTER — Encounter: Payer: Self-pay | Admitting: Family Medicine

## 2013-08-05 ENCOUNTER — Ambulatory Visit (INDEPENDENT_AMBULATORY_CARE_PROVIDER_SITE_OTHER): Payer: Medicare PPO | Admitting: Family Medicine

## 2013-08-05 VITALS — BP 118/68 | HR 77 | Resp 18 | Ht 71.0 in | Wt 249.0 lb

## 2013-08-05 DIAGNOSIS — G4730 Sleep apnea, unspecified: Secondary | ICD-10-CM

## 2013-08-05 DIAGNOSIS — M47817 Spondylosis without myelopathy or radiculopathy, lumbosacral region: Secondary | ICD-10-CM

## 2013-08-05 DIAGNOSIS — Z0000 Encounter for general adult medical examination without abnormal findings: Secondary | ICD-10-CM

## 2013-08-05 DIAGNOSIS — F329 Major depressive disorder, single episode, unspecified: Secondary | ICD-10-CM

## 2013-08-05 DIAGNOSIS — M47816 Spondylosis without myelopathy or radiculopathy, lumbar region: Secondary | ICD-10-CM

## 2013-08-05 DIAGNOSIS — Z23 Encounter for immunization: Secondary | ICD-10-CM

## 2013-08-05 DIAGNOSIS — F3289 Other specified depressive episodes: Secondary | ICD-10-CM

## 2013-08-05 MED ORDER — OXYCODONE HCL 5 MG PO TABS: ORAL_TABLET | ORAL | Status: DC

## 2013-08-05 MED ORDER — BUPROPION HCL ER (XL) 300 MG PO TB24: 300.0000 mg | ORAL_TABLET | ORAL | Status: AC

## 2013-08-05 MED ORDER — GABAPENTIN 300 MG PO CAPS: ORAL_CAPSULE | ORAL | Status: AC

## 2013-08-05 MED ORDER — FLUOXETINE HCL 10 MG PO TABS
10.0000 mg | ORAL_TABLET | Freq: Every day | ORAL | Status: DC
Start: 2013-08-05 — End: 2017-06-29

## 2013-08-05 NOTE — Patient Instructions (Addendum)
F/U in in 9 weeks, call if you need me before  Flu vaccine today  Cut back cheese, butter, egg yolks, your TG are too high  New higher dose of pain med  Will start today , you need to take one oxycodone 5mg  tabs  Three times daily . You can fill your new script on October 6, we will give this to you today   For uncontrolled allergies, use loratidine and flonase EVERY day as prescribed. Saline flush nostrils 3 times daily till better. OK to use benadryl 25mg  one at bedtime fo allergies , will also help with sleep OK to use OTC afrin nasal spray twice daily for the next 3 days only  New additional medication for depression, is fluoxetine 10mg  one daily

## 2013-08-05 NOTE — Progress Notes (Signed)
Subjective:    Patient ID: Derrick Hinton, male    DOB: 1961/01/23, 52 y.o.   MRN: [id number]  HPI  Preventive Screening-Counseling & Management   Patient present here today for a Medicare annual wellness visit. C/o uncontrolled allergies x 3 weeks, watery eyes , stuffy nose , no fever or chills, can hardly breathe Uncontrolled pain, neck, back and both hips and both lower extremities x 4 weeks, has been getting pai meds from this office in recent times once more as he had been to pain clinic states the epidural was not beneficial, and the cost of getting pain meds from the pain clinic was prohibitive.  Pt has been on one oxycodone 5mg  tablet, however asks to be up dosed to 10mg  twice daily , due to pain, after much consideration, I moved the dose up to 5 mg three times daily till re evaluation. After the visit, a note from his orthopedic Doc led me to believe that the patient has also been obtaining pain medications from the orthopedic Doc, and this indeed proved to be the case. A note was sent to the pharmacy not to fill the new script I provided him with today for the increased dose of oxycodone , and pt was notified by telephone by the nurse who I instructed to call him. An appointment will be made next week to address this during an office visit. I have not yet contacted the orthopedic Doc's office but will do so after I speak face to face with the patient. I will not prescribe narcotic pain medication for him anymore, he will need to get it through a pain clinic , unless orthopedic Doc elects to continue  To treat his joint pains.  Current Problems (verified)   Medications Prior to Visit Allergies (verified)   PAST HISTORY  Family History No known cancer , depression, dementia, 2 sibs had MI under age 24  Social History Married dad of 4 kids, disabled due back pain since age approx 8. No drug use   Risk Factors  Current exercise habits:  unable  Dietary issues discussed:low  sugar , and low carb, low fat   Cardiac risk factors:Family history, h/o nicotine. IDDM  Depression Screen  (Note: if answer to either of the following is "Yes", a more complete depression screening is indicated)   Over the past two weeks, have you felt down, depressed or hopeless? yes Over the past two weeks, have you felt little interest or pleasure in doing things? yes Have you lost interest or pleasure in daily life? yes Do you often feel hopeless? yes Do you cry easily over simple problems? No  PHQ 9 score of 16 today Activities of Daily Living  In your present state of health, do you have any difficulty performing the following activities?  Driving?: sometimes, unable to drive for along period Managing money?: No Feeding yourself?:No Getting from bed to chair?:sometimes Climbing a flight of stairs?:yes Preparing food and eating?:No Bathing or showering?:No Getting dressed?:difficulty bending to put on pants Getting to the toilet?:No Using the toilet?:No Moving around from place to place?: No  Fall Risk Assessment In the past year have you fallen or had a near fall?:No Are you currently taking any medications that make you dizzy?:No   Hearing Difficulties: No Do you often ask people to speak up or repeat themselves?:No Do you experience ringing or noises in your ears?:No Do you have difficulty understanding soft or whispered voices?:No  Cognitive Testing  Alert? Yes Normal Appearance?Yes  Oriented to person? Yes Place? Yes  Time? Yes  Displays appropriate judgment?Yes  Can read the correct time from a watch face? yes Are you having problems remembering things?No  Advanced Directives have been discussed with the patient?Yes , full code   List the Names of Other Physician/Practitioners you currently use: orthopedics, endocrinology, [facility], pain clinic   Indicate any recent Medical Services you may have received from other than [facility] providers in the past year  (date may be approximate).   Assessment:    Annual Wellness Exam   Plan:    During the course of the visit the patient was educated and counseled about appropriate screening and preventive services including:  A healthy diet is rich in fruit, vegetables and whole grains. Poultry fish, nuts and beans are a healthy choice for protein rather then red meat. A low sodium diet and drinking 64 ounces of water daily is generally recommended. Oils and sweet should be limited. Carbohydrates especially for those who are diabetic or overweight, should be limited to 30-45 gram per meal. It is important to eat on a regular schedule, at least 3 times daily. Snacks should be primarily fruits, vegetables or nuts. It is important that you exercise regularly at least 30 minutes 5 times a week. If you develop chest pain, have severe difficulty breathing, or feel very tired, stop exercising immediately and seek medical attention  Immunization reviewed and updated. Cancer screening reviewed and updated    Patient Instructions (the written plan) was given to the patient.  Medicare Attestation  I have personally reviewed:  The patient's medical and social history  Their use of alcohol, tobacco or illicit drugs  Their current medications and supplements  The patient's functional ability including ADLs,fall risks, home safety risks, cognitive, and hearing and visual impairment  Diet and physical activities  Evidence for depression or mood disorders  The patient's weight, height, BMI, and visual acuity have been recorded in the chart. I have made referrals, counseling, and provided education to the patient based on review of the above and I have provided the patient with a written personalized care plan for preventive services.     Review of Systems     Objective:   Physical Exam  Patient alert and oriented and in no cardiopulmonary distress.  HEENT: No facial asymmetry, EOMI, no sinus tenderness,   oropharynx pink and moist.  Neck decreased ROM, no adenopathy.Nasal mucosa erythematous and edematous, pt has nasal speech due to congestion  Chest: Clear to auscultation bilaterally.  CVS: S1, S2 no murmurs, no S3.  ABD: Soft non tender. Bowel sounds normal.  Ext: No edema  MS: decreased ROM spine,  hips and knees.  Skin: Intact, no ulcerations or rash noted.  Psych: Good eye contact, flat  affect. Memory intact not anxious but  depressed appearing.  CNS: CN 2-12 intact, power, tone and sensation normal throughout.       Assessment & Plan:

## 2013-08-06 ENCOUNTER — Other Ambulatory Visit: Payer: Self-pay | Admitting: Family Medicine

## 2013-08-06 DIAGNOSIS — Z0000 Encounter for general adult medical examination without abnormal findings: Secondary | ICD-10-CM | POA: Insufficient documentation

## 2013-08-06 NOTE — Assessment & Plan Note (Signed)
Still has not followed through on referral will discuss at next visit

## 2013-08-06 NOTE — Assessment & Plan Note (Addendum)
Uncontrolled pain, which is disabling, medication dose adjustment made, unfortunately however, pt also currently receiving med from another Provider , so script provided at visit is stopped and he will need OV next week to discuss pain mx moving forward

## 2013-08-06 NOTE — Assessment & Plan Note (Signed)
Uncontrolled, add SSRI to regime

## 2013-08-06 NOTE — Assessment & Plan Note (Addendum)
Annual wellness as documented. Pt severely limited in mobility due to uncontrolled joint pain and severe arthritis also limits  Mobility despite previous surgery on back, most recent in 2013. Pain management cited as a main concern at visit, I attempted to meet patient's expressed needs by increasing medication dose, only to realize after the visit that he is currently also reeciving narcotic pain meds from another prescriber concurrently, so this will be addressed in an a visit next week directly with pt. He has been made aware that script provided today is void. Patient's depression screen is high, though he is not suicidal or homicidal, additional anti depressant to be started Family and personal history increase his risk of CAD

## 2013-08-31 IMAGING — CR DG CHEST 2V
2 series · 2 of 2 positions shown · non-contrast
Comparison: Chest x-ray of 01/29/2011

CLINICAL DATA: Preop for back surgery, smoking history

CHEST - 2 VIEW

[view not recorded (1 of 2)]
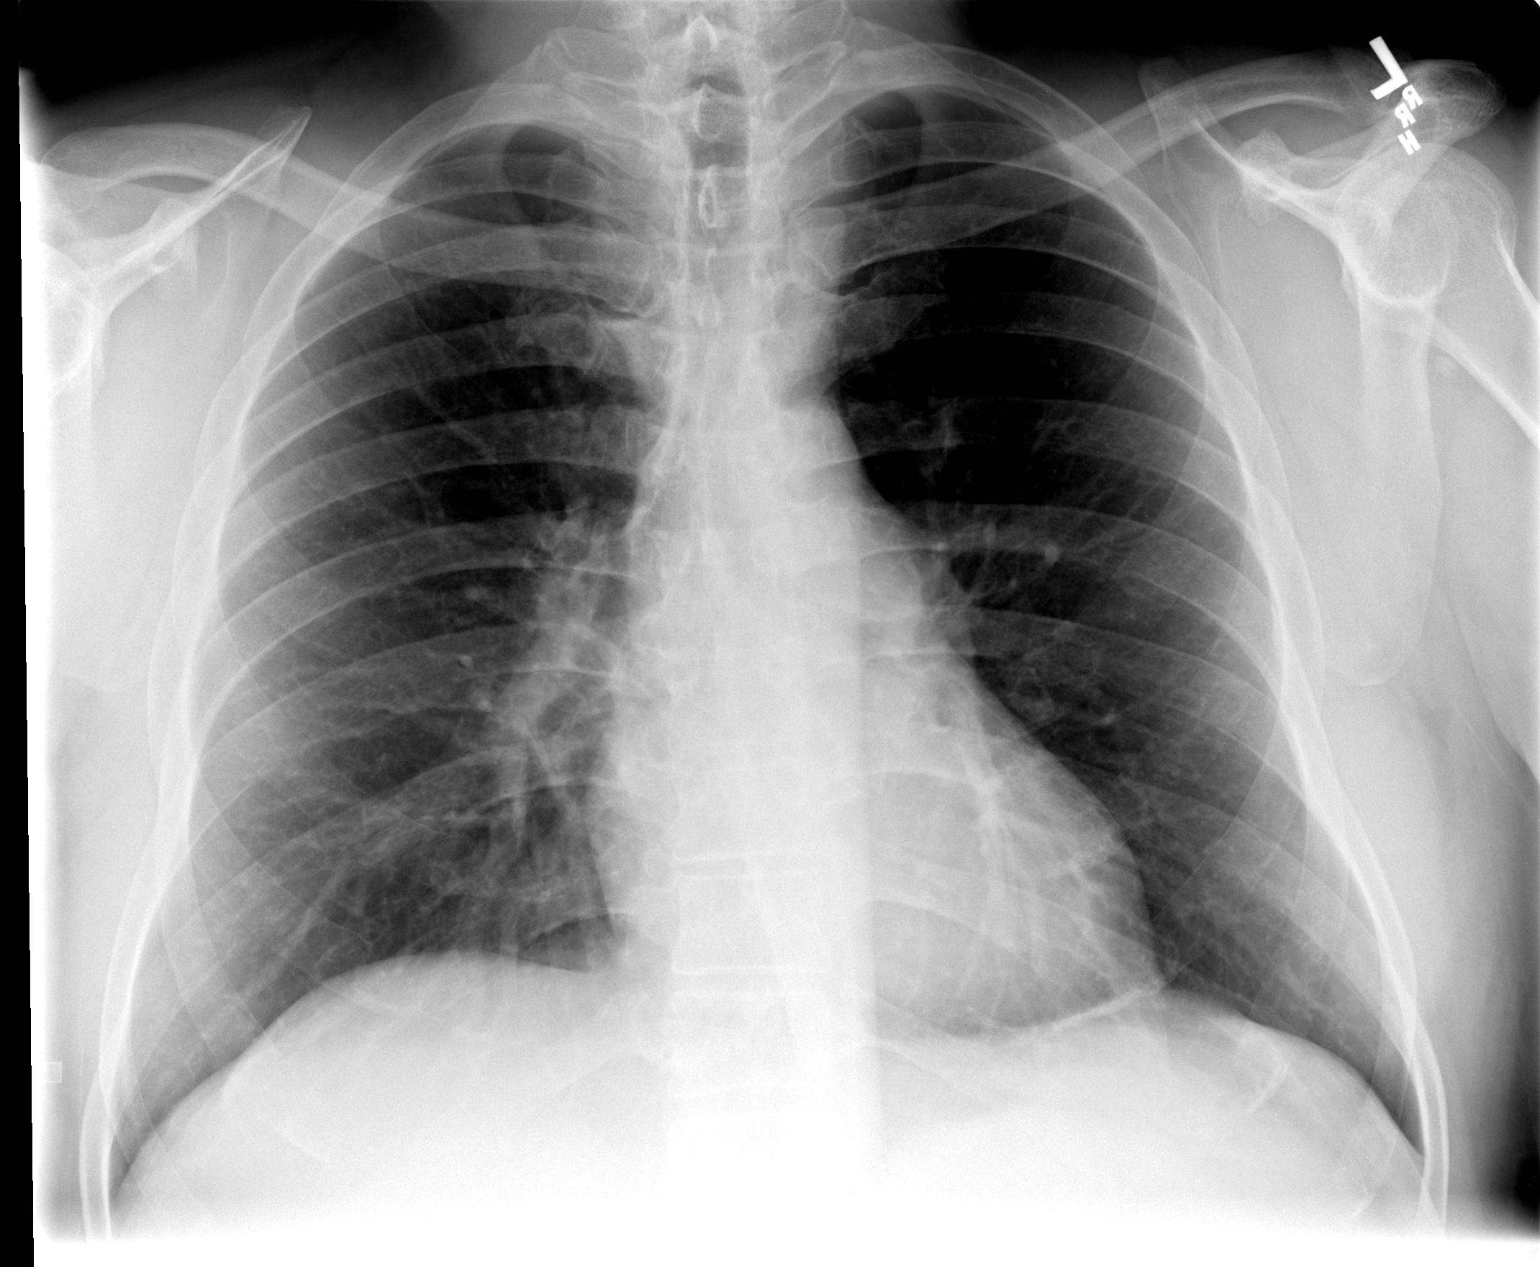

[view not recorded (2 of 2)]
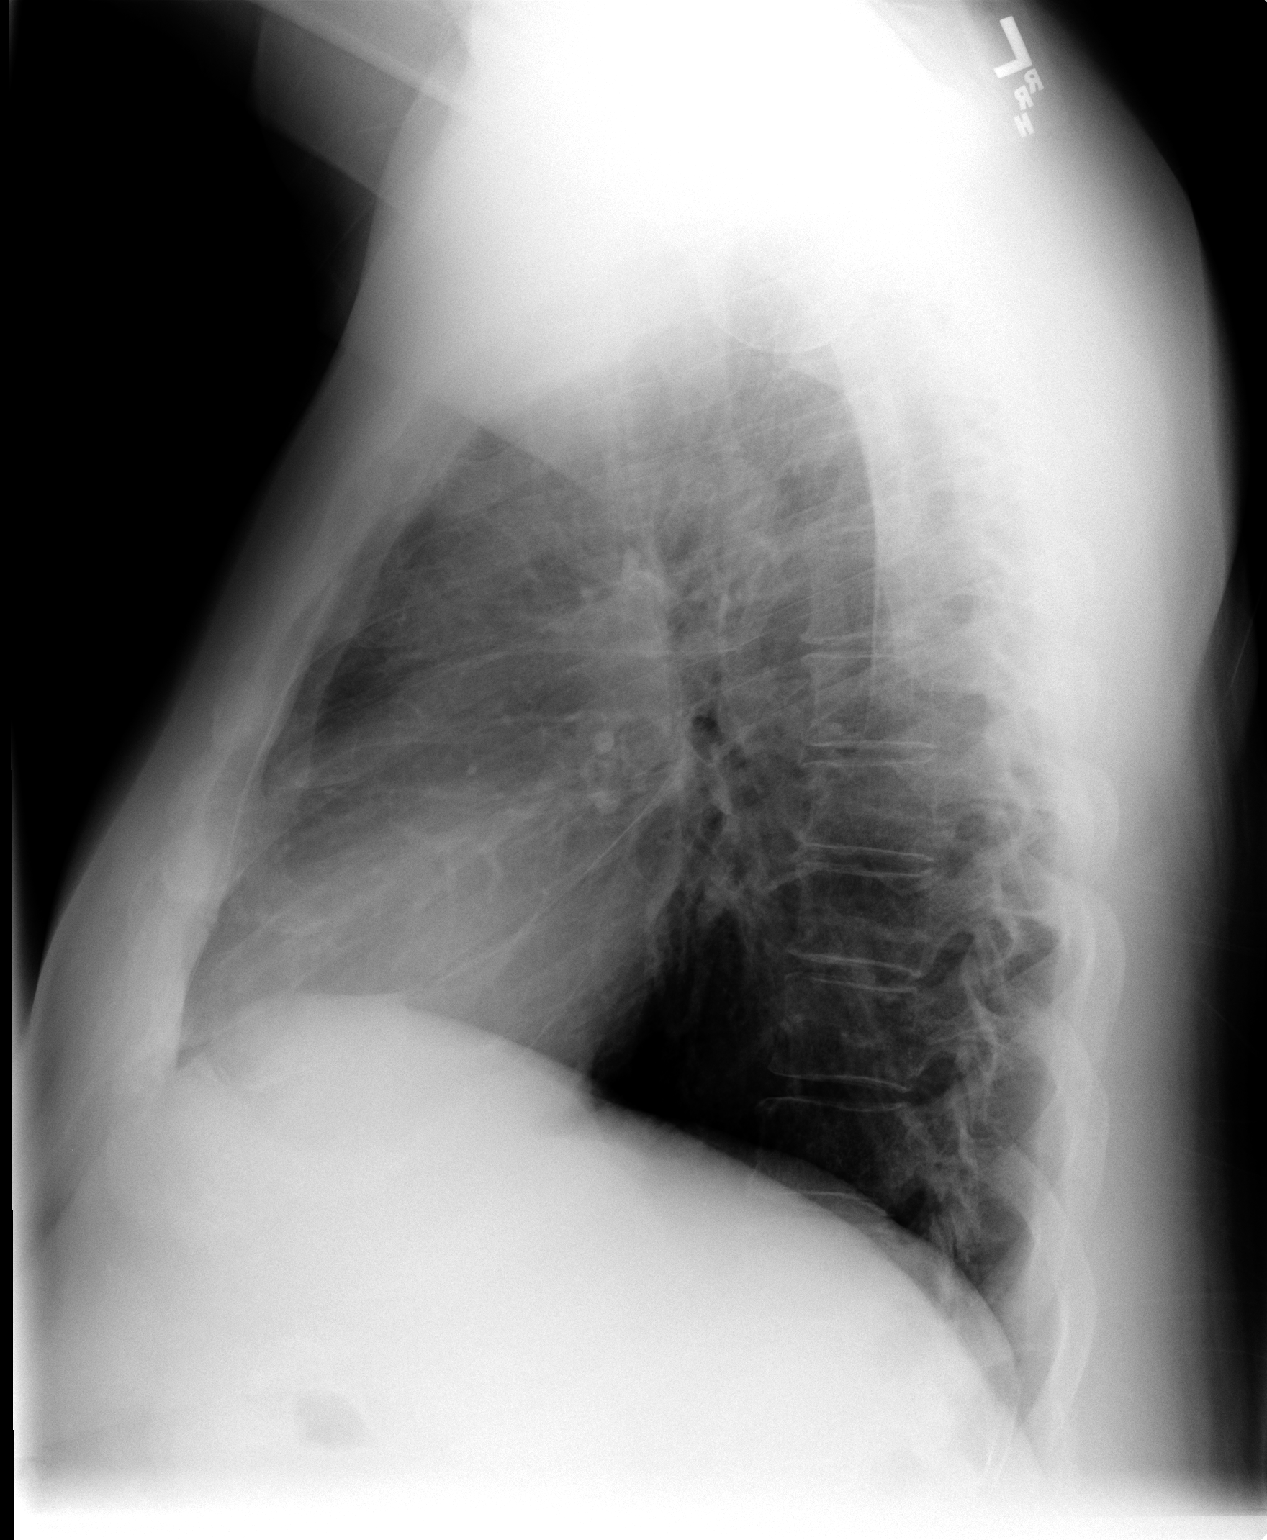

[2 of 2 positions shown; findings below may reference images not displayed]

FINDINGS: The lungs are clear.  Mediastinal contours appear stable.
The heart is within normal limits in size.  No bony abnormality is
seen.
IMPRESSION: No active lung disease.

## 2013-09-08 ENCOUNTER — Other Ambulatory Visit: Payer: Self-pay

## 2013-10-06 ENCOUNTER — Ambulatory Visit: Payer: Medicare PPO | Admitting: Family Medicine

## 2013-10-14 ENCOUNTER — Other Ambulatory Visit: Payer: Self-pay | Admitting: Family Medicine

## 2014-02-09 ENCOUNTER — Other Ambulatory Visit: Payer: Self-pay

## 2014-03-07 LAB — HM DIABETES EYE EXAM

## 2014-07-27 ENCOUNTER — Telehealth: Payer: Self-pay | Admitting: Family Medicine

## 2014-07-27 NOTE — Telephone Encounter (Signed)
Noted  

## 2014-11-06 ENCOUNTER — Other Ambulatory Visit: Payer: Self-pay | Admitting: Family Medicine

## 2015-04-30 ENCOUNTER — Other Ambulatory Visit: Payer: Self-pay

## 2017-05-05 ENCOUNTER — Telehealth (INDEPENDENT_AMBULATORY_CARE_PROVIDER_SITE_OTHER): Payer: Self-pay

## 2017-05-05 NOTE — Telephone Encounter (Signed)
Pt questioned is referral was needed for [employer] and advised that this is not needed.

## 2017-06-29 ENCOUNTER — Encounter (INDEPENDENT_AMBULATORY_CARE_PROVIDER_SITE_OTHER): Payer: Self-pay | Admitting: Specialist

## 2017-06-29 ENCOUNTER — Ambulatory Visit (INDEPENDENT_AMBULATORY_CARE_PROVIDER_SITE_OTHER): Payer: Medicare PPO

## 2017-06-29 ENCOUNTER — Ambulatory Visit (INDEPENDENT_AMBULATORY_CARE_PROVIDER_SITE_OTHER): Payer: Medicare PPO | Admitting: Specialist

## 2017-06-29 VITALS — BP 150/82 | HR 69 | Ht 69.0 in | Wt 216.0 lb

## 2017-06-29 DIAGNOSIS — M545 Low back pain: Secondary | ICD-10-CM | POA: Diagnosis not present

## 2017-06-29 DIAGNOSIS — G8929 Other chronic pain: Secondary | ICD-10-CM

## 2017-06-29 DIAGNOSIS — Z981 Arthrodesis status: Secondary | ICD-10-CM

## 2017-06-29 DIAGNOSIS — M5136 Other intervertebral disc degeneration, lumbar region: Secondary | ICD-10-CM | POA: Diagnosis not present

## 2017-06-29 NOTE — Progress Notes (Signed)
Office Visit Note   Patient: Derrick Hinton           Date of Birth: 01-19-1961           MRN: [id number] Visit Date: 06/29/2017              Requested by: No referring provider defined for this encounter. PCP: No primary care provider on file.   Assessment & Plan: Visit Diagnoses:  1. Chronic low back pain, unspecified back pain laterality, with sciatica presence unspecified   2. History of lumbar fusion   3. Degenerative disc disease, lumbar     Plan:Avoid bending, stooping and avoid lifting weights greater than 10 lbs. Avoid prolong standing and walking. Avoid frequent bending and stooping  No lifting greater than 10 lbs. May use ice or moist heat for pain. Weight loss is of benefit. Handicap license is approved. MRI of the lumbar spine is ordered, with and without contrast, a GFR is ordered.   Follow-Up Instructions: Return in about 3 weeks (around 07/20/2017).   Orders:  Orders Placed This Encounter  Procedures  . XR Lumbar Spine 2-3 Views  . MR Lumbar Spine W Wo Contrast  . Estimated GFR   No orders of the defined types were placed in this encounter.     Procedures: No procedures performed   Clinical Data: No additional findings.   Subjective: Chief Complaint  Patient presents with  . Lower Back - Follow-up, Pain    HPI  Review of Systems   Objective: Vital Signs: BP (!) 150/82 (BP Location: Left Arm, Patient Position: Sitting)   Pulse 69   Ht 5\' 9"  (1.753 m)   Wt 216 lb (98 kg)   BMI 31.90 kg/m   Physical Exam  Ortho Exam  Specialty Comments:  No specialty comments available.  Imaging: Xr Lumbar Spine 2-3 Views  Result Date: 06/29/2017 Lumbar fusion L3- S1 with interbody fusion masses, the fusions appear solid and the degree of restoration of lordosis is decreased or there is a relative flat back deformity present across the 3 level fusion with the lordotic angle L1-S1 of 39 degrees. DDD with spondylosis and vacuum sign at the  L2-3 level.     PMFS History: Patient Active Problem List   Diagnosis Date Noted  . Degenerative spondylolisthesis 06/29/2012    Priority: High    Class: Chronic  . Spinal stenosis of lumbar region with neurogenic claudication 06/29/2012    Priority: High    Class: Chronic  . Medicare annual wellness visit, subsequent 08/06/2013  . Unspecified sleep apnea 04/05/2013  . Depressive disorder, not elsewhere classified 10/19/2012  . Postoperative ileus (HCC) 07/01/2012    Class: Acute  . COPD (chronic obstructive pulmonary disease) (HCC) 08/12/2011  . 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 Diabetes mellitus, insulin dependent (IDDM), uncontrolled (HCC) 08/29/2008  . HYPERLIPIDEMIA 02/15/2008  . OBESITY 02/15/2008  . HYPERTENSION 02/15/2008  . ALLERGIC RHINITIS 02/15/2008  . 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 Allergic rhinitis   . Arthritis   . Back pain   . Chronic kidney disease    Sees Dr. Kristian Covey for kidney function  . Depression    takes meds daily  . Diabetes mellitus, type 1    takes insulin daily - fasting sugar around 160s  . Hyperlipidemia   . Hypertension    takes meds daily  . Obesity   . Tobacco abuse    40  pack years; discontinued in 05/2012    Family History  Problem Relation Age of Onset  . Hypertension Mother   . Hypertension Father   . Hypertension Sister   . Hypertension Brother   . Hypertension Brother     Past Surgical History:  Procedure Laterality Date  . APPENDECTOMY  1975  . BACK SURGERY  2008  . cyst removed from left lung  1995  . right arm surgery to remove bursitis pocket and bone spur  july 2011   Social History   Occupational History  . disabled     Social History Main Topics  . Smoking status: Former Smoker    Packs/day: 0.50    Types: Cigarettes    Quit date: 06/15/2013  . Smokeless tobacco: Not on file  . Alcohol use No  . Drug use: No  . Sexual  activity: Yes

## 2017-06-29 NOTE — Patient Instructions (Signed)
Avoid bending, stooping and avoid lifting weights greater than 10 lbs. Avoid prolong standing and walking. Avoid frequent bending and stooping  No lifting greater than 10 lbs. May use ice or moist heat for pain. Weight loss is of benefit. Handicap license is approved. MRI of the lumbar spine is ordered, with and without contrast, a GFR is ordered.

## 2017-07-03 LAB — COMPREHENSIVE METABOLIC PANEL
ALBUMIN: 4.2 g/dL (ref 3.6–5.1)
ALT: 28 U/L (ref 9–46)
AST: 19 U/L (ref 10–35)
Alkaline Phosphatase: 75 U/L (ref 40–115)
BILIRUBIN TOTAL: 0.4 mg/dL (ref 0.2–1.2)
BUN: 13 mg/dL (ref 7–25)
CO2: 23 mmol/L (ref 20–32)
CREATININE: 0.85 mg/dL (ref 0.70–1.33)
Calcium: 9.5 mg/dL (ref 8.6–10.3)
Chloride: 100 mmol/L (ref 98–110)
Glucose, Bld: 77 mg/dL (ref 65–99)
Potassium: 5 mmol/L (ref 3.5–5.3)
SODIUM: 139 mmol/L (ref 135–146)
Total Protein: 7.1 g/dL (ref 6.1–8.1)

## 2017-07-03 LAB — ESTIMATED GFR
GFR, Est African American: >89 mL/min (ref >=60)
GFR, Est Non African American: >89 mL/min (ref >=60)

## 2017-07-09 ENCOUNTER — Ambulatory Visit
Admission: RE | Admit: 2017-07-09 | Discharge: 2017-07-09 | Disposition: A | Discharge status: Home / Self Care | Payer: Medicare PPO | Source: Ambulatory Visit | Attending: Specialist | Admitting: Specialist

## 2017-07-09 DIAGNOSIS — M545 Low back pain: Principal | ICD-10-CM

## 2017-07-09 DIAGNOSIS — Z981 Arthrodesis status: Secondary | ICD-10-CM

## 2017-07-09 DIAGNOSIS — G8929 Other chronic pain: Secondary | ICD-10-CM

## 2017-07-09 DIAGNOSIS — M5136 Other intervertebral disc degeneration, lumbar region: Secondary | ICD-10-CM

## 2017-07-09 MED ORDER — GADOBENATE DIMEGLUMINE 529 MG/ML IV SOLN
20.0000 mL | Freq: Once | INTRAVENOUS | Status: AC | PRN
  Administered 2017-07-09: 20 mL via INTRAVENOUS

## 2017-08-13 ENCOUNTER — Ambulatory Visit (INDEPENDENT_AMBULATORY_CARE_PROVIDER_SITE_OTHER): Payer: Medicare PPO | Admitting: Specialist

## 2017-08-13 VITALS — BP 131/77 | HR 80 | Ht 69.0 in | Wt 216.0 lb

## 2017-08-13 DIAGNOSIS — M48062 Spinal stenosis, lumbar region with neurogenic claudication: Secondary | ICD-10-CM | POA: Diagnosis not present

## 2017-08-13 DIAGNOSIS — G894 Chronic pain syndrome: Secondary | ICD-10-CM | POA: Diagnosis not present

## 2017-08-13 NOTE — Patient Instructions (Signed)
Avoid bending, stooping and avoid lifting weights greater than 10 lbs. Avoid prolong standing and walking. Avoid frequent bending and stooping  No lifting greater than 10 lbs. May use ice or moist heat for pain. Weight loss is of benefit. Handicap license is approved. A spinal cord stimulator may be of benefit and I wrote a prescription to be given to Dr.Bhat, pain management in [place].

## 2017-08-13 NOTE — Progress Notes (Signed)
Office Visit Note   Patient: Derrick Hinton           Date of Birth: 01/15/1961           MRN: [id number] Visit Date: 08/13/2017              Requested by: No referring provider defined for this encounter. PCP: Larene Pickett, FNP   Assessment & Plan: Visit Diagnoses:  1. Spinal stenosis of lumbar region with neurogenic claudication   2. Chronic pain syndrome     Plan: Avoid bending, stooping and avoid lifting weights greater than 10 lbs. Avoid prolong standing and walking. Avoid frequent bending and stooping  No lifting greater than 10 lbs. May use ice or moist heat for pain. Weight loss is of benefit. Handicap license is approved. A spinal cord stimulator may be of benefit and I wrote a prescription to be given to Dr.Bhat, pain management in [place].  Follow-Up Instructions: Return in about 6 months (around 02/11/2018).   Orders:  No orders of the defined types were placed in this encounter.  No orders of the defined types were placed in this encounter.     Procedures: No procedures performed   Clinical Data: No additional findings.   Subjective: Chief Complaint  Patient presents with  . Lower Back - Follow-up    56 year old male with history of low back pain s/p lumbar fusion in 2013. He reports persistent pain with sitting greater after 30-35 minutes. He experiences  Difficulty with prolong standing and walking and he feelslike his back may stiffening. Had cortisone shots in the past but they are not    Review of Systems  Constitutional: Negative.   HENT: Negative.   Eyes: Negative.   Respiratory: Negative.   Cardiovascular: Negative.   Gastrointestinal: Negative.   Endocrine: Negative.   Genitourinary: Negative.   Musculoskeletal: Negative.   Skin: Negative.   Allergic/Immunologic: Negative.   Neurological: Negative.   Hematological: Negative.   Psychiatric/Behavioral: Negative.      Objective: Vital Signs: BP 131/77 (BP Location:  Left Arm, Patient Position: Sitting)   Pulse 80   Ht  (1.753 m)   Wt 216 lb (98 kg)   BMI 31.90 kg/m   Physical Exam  Constitutional: He is oriented to person, place, and time. He appears well-developed and well-nourished.  HENT:  Head: Normocephalic and atraumatic.  Eyes: Pupils are equal, round, and reactive to light. EOM are normal.  Neck: Normal range of motion. Neck supple.  Pulmonary/Chest: Effort normal and breath sounds normal.  Abdominal: Soft. Bowel sounds are normal.  Neurological: He is alert and oriented to person, place, and time.  Skin: Skin is warm and dry.  Psychiatric: He has a normal mood and affect. His behavior is normal. Judgment and thought content normal.    Back Exam   Tenderness  The patient is experiencing tenderness in the lumbar.  Range of Motion  Extension: abnormal  Flexion: normal  Lateral Bend Right: abnormal  Lateral Bend Left: abnormal  Rotation Right: abnormal  Rotation Left: abnormal   Muscle Strength  Right Quadriceps:  5/5  Left Quadriceps:  5/5  Right Hamstrings:  5/5  Left Hamstrings:  5/5   Tests  Straight leg raise right: negative Straight leg raise left: negative  Reflexes  Patellar: 0/4 Achilles: 0/4 Babinski's sign: normal   Other  Toe Walk: normal Heel Walk: normal Sensation: normal Gait: normal  Erythema: no back redness Scars: absent  Specialty Comments:  No specialty comments available.  Imaging: No results found.   PMFS History: Patient Active Problem List   Diagnosis Date Noted  . Degenerative spondylolisthesis 06/29/2012    Priority: High    Class: Chronic  . Spinal stenosis of lumbar region with neurogenic claudication 06/29/2012    Priority: High    Class: Chronic  . Medicare annual wellness visit, subsequent 08/06/2013  . Unspecified sleep apnea 04/05/2013  . Depressive disorder, not elsewhere classified 10/19/2012  . Postoperative ileus (HCC) 07/01/2012    Class: Acute  .  COPD (chronic obstructive pulmonary disease) (HCC) 08/12/2011  . 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 Diabetes mellitus, insulin dependent (IDDM), uncontrolled (HCC) 08/29/2008  . HYPERLIPIDEMIA 02/15/2008  . OBESITY 02/15/2008  . HYPERTENSION 02/15/2008  . ALLERGIC RHINITIS 02/15/2008  . 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 Allergic rhinitis   . Arthritis   . Back pain   . Chronic kidney disease    Sees Dr. Kristian Covey for kidney function  . Depression    takes meds daily  . Diabetes mellitus, type 1    takes insulin daily - fasting sugar around 160s  . Hyperlipidemia   . Hypertension    takes meds daily  . Obesity   . Tobacco abuse    40 pack years; discontinued in 05/2012    Family History  Problem Relation Age of Onset  . Hypertension Mother   . Hypertension Father   . Hypertension Sister   . Hypertension Brother   . Hypertension Brother     Past Surgical History:  Procedure Laterality Date  . APPENDECTOMY  1975  . BACK SURGERY  2008  . cyst removed from left lung  1995  . right arm surgery to remove bursitis pocket and bone spur  july 2011   Social History   Occupational History  . disabled     Social History Main Topics  . Smoking status: Former Smoker    Packs/day: 0.50    Types: Cigarettes    Quit date: 06/15/2013  . Smokeless tobacco: Not on file  . Alcohol use No  . Drug use: No  . Sexual activity: Yes

## 2017-08-20 ENCOUNTER — Telehealth (INDEPENDENT_AMBULATORY_CARE_PROVIDER_SITE_OTHER): Payer: Self-pay | Admitting: Specialist

## 2017-08-20 NOTE — Telephone Encounter (Signed)
08/13/2017 OV NOTE FAXED TO [facility] [phone], ATTNJudeth Cornfield: STEPHANIE

## 2017-11-12 ENCOUNTER — Encounter (INDEPENDENT_AMBULATORY_CARE_PROVIDER_SITE_OTHER): Payer: Self-pay | Admitting: Specialist

## 2019-09-08 ENCOUNTER — Encounter: Payer: Self-pay | Admitting: Gastroenterology

## 2023-05-04 DEATH — deceased
